# Patient Record
Sex: Male | Born: 1965 | Race: White | Hispanic: No | Marital: Married | Smoking: Never smoker
Health system: Southern US, Community
[De-identification: ages and names within clinical notes are randomized; demographics above are authoritative.]

## PROBLEM LIST (undated history)

## (undated) DIAGNOSIS — M1990 Unspecified osteoarthritis, unspecified site: Secondary | ICD-10-CM

## (undated) HISTORY — PX: APPENDECTOMY: SHX54

---

## 2010-08-30 ENCOUNTER — Ambulatory Visit (HOSPITAL_COMMUNITY)
Admission: RE | Admit: 2010-08-30 | Discharge: 2010-08-30 | Disposition: A | Discharge status: Home / Self Care | Payer: Self-pay | Source: Ambulatory Visit | Attending: Family Medicine | Admitting: Family Medicine

## 2010-08-30 ENCOUNTER — Other Ambulatory Visit (HOSPITAL_COMMUNITY): Payer: Self-pay | Admitting: Family Medicine

## 2010-08-30 DIAGNOSIS — M545 Low back pain, unspecified: Secondary | ICD-10-CM | POA: Insufficient documentation

## 2010-08-30 DIAGNOSIS — M549 Dorsalgia, unspecified: Secondary | ICD-10-CM

## 2012-05-17 IMAGING — CR DG LUMBAR SPINE COMPLETE 4+V
5 series · 5 of 5 positions shown · non-contrast
Comparison: None

CLINICAL DATA: Chronic back pain

LUMBAR SPINE - COMPLETE 4+ VIEW

[view not recorded (1 of 5)]
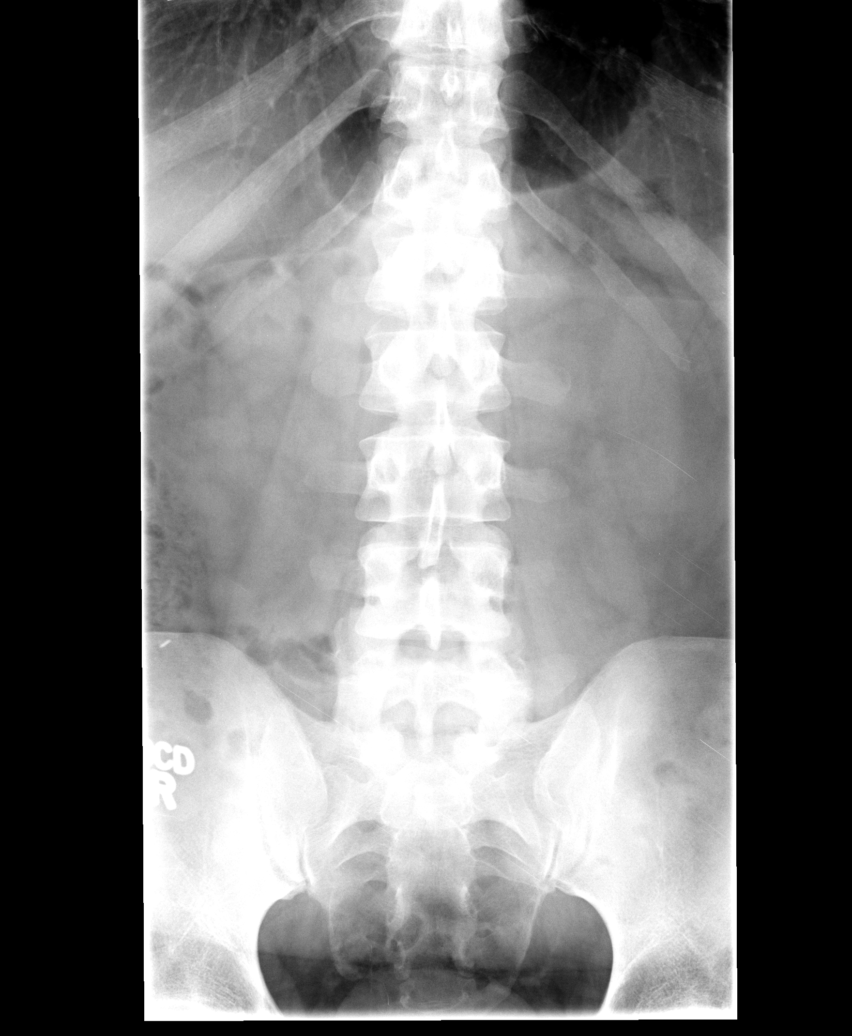

[view not recorded (2 of 5)]
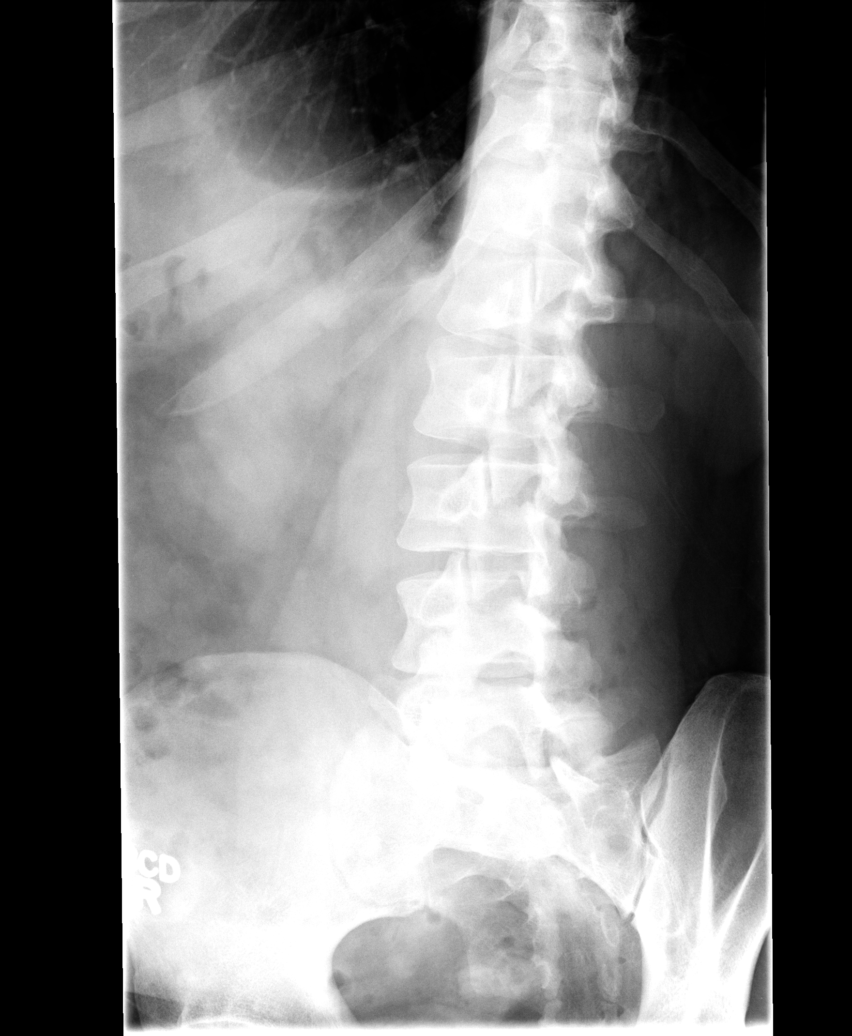

[view not recorded (3 of 5)]
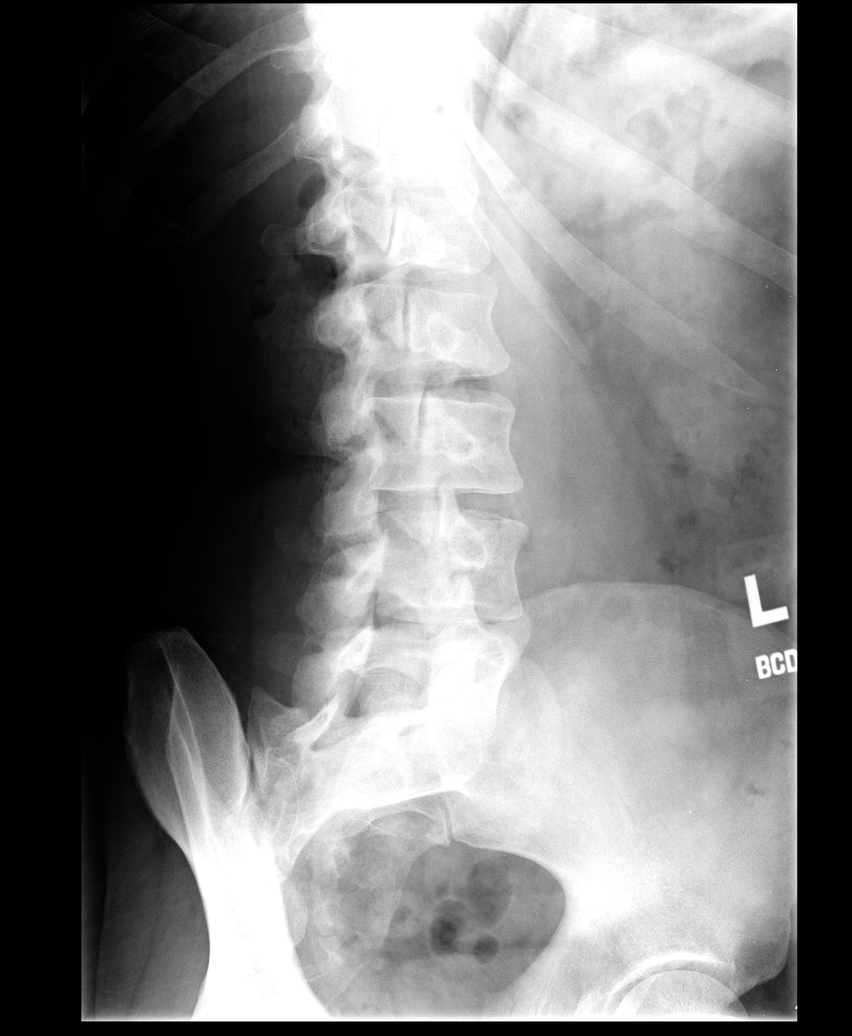

[view not recorded (4 of 5)]
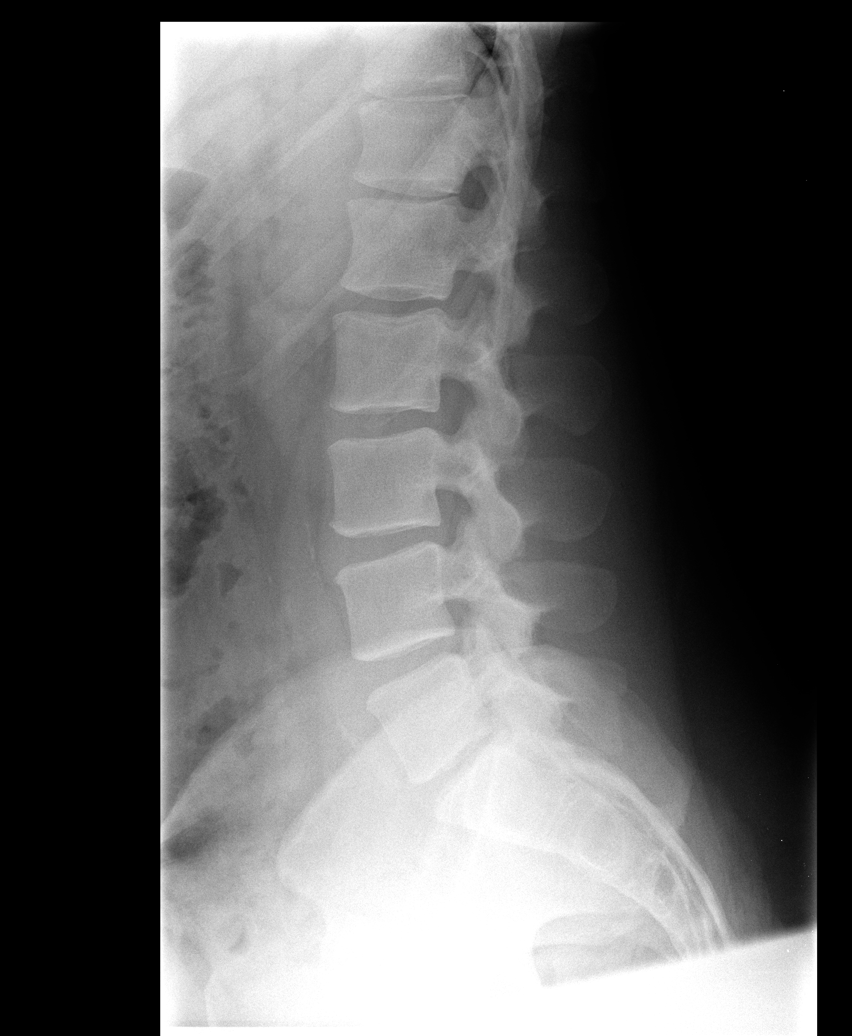

[view not recorded (5 of 5)]
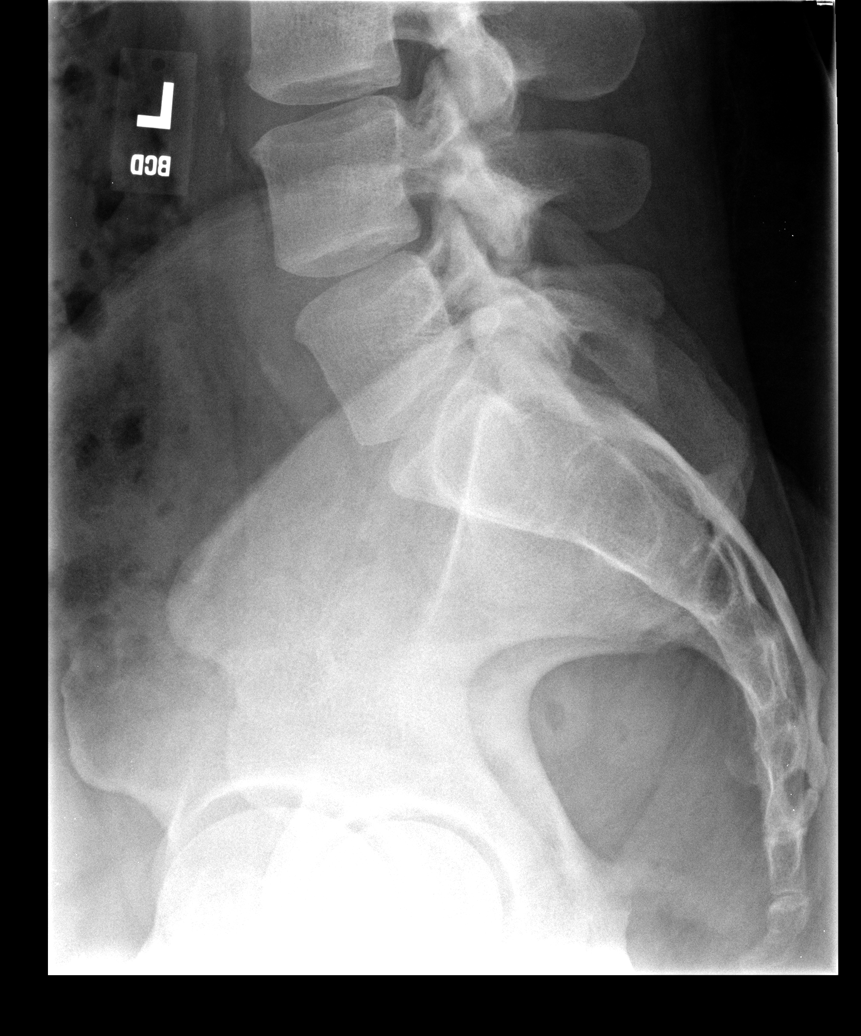

[5 of 5 positions shown; findings below may reference images not displayed]

FINDINGS: Five non-rib bearing lumbar type vertebrae.
Vertebral body and disc space heights maintained.
Bone mineralization normal.
No acute fracture, subluxation or bone destruction.
Minimal scattered atherosclerotic calcification aorta.
No spondylolysis.
SI joints symmetric.
IMPRESSION: No acute abnormalities.

## 2019-01-04 ENCOUNTER — Other Ambulatory Visit: Payer: Self-pay

## 2019-01-04 DIAGNOSIS — Z20822 Contact with and (suspected) exposure to covid-19: Secondary | ICD-10-CM

## 2019-01-06 LAB — NOVEL CORONAVIRUS, NAA: SARS-CoV-2, NAA: NOT DETECTED

## 2022-01-14 DIAGNOSIS — Z419 Encounter for procedure for purposes other than remedying health state, unspecified: Secondary | ICD-10-CM | POA: Diagnosis not present

## 2022-02-14 DIAGNOSIS — Z419 Encounter for procedure for purposes other than remedying health state, unspecified: Secondary | ICD-10-CM | POA: Diagnosis not present

## 2022-03-08 ENCOUNTER — Telehealth: Payer: Self-pay

## 2022-03-08 NOTE — Telephone Encounter (Signed)
Mychart msg sent. AS, CMA 

## 2022-03-17 DIAGNOSIS — Z419 Encounter for procedure for purposes other than remedying health state, unspecified: Secondary | ICD-10-CM | POA: Diagnosis not present

## 2022-04-15 DIAGNOSIS — Z419 Encounter for procedure for purposes other than remedying health state, unspecified: Secondary | ICD-10-CM | POA: Diagnosis not present

## 2022-05-16 DIAGNOSIS — Z419 Encounter for procedure for purposes other than remedying health state, unspecified: Secondary | ICD-10-CM | POA: Diagnosis not present

## 2022-06-15 DIAGNOSIS — Z419 Encounter for procedure for purposes other than remedying health state, unspecified: Secondary | ICD-10-CM | POA: Diagnosis not present

## 2022-07-16 DIAGNOSIS — Z419 Encounter for procedure for purposes other than remedying health state, unspecified: Secondary | ICD-10-CM | POA: Diagnosis not present

## 2022-08-03 ENCOUNTER — Other Ambulatory Visit: Payer: Self-pay

## 2022-08-03 ENCOUNTER — Encounter (HOSPITAL_COMMUNITY): Payer: Self-pay

## 2022-08-03 ENCOUNTER — Emergency Department (HOSPITAL_COMMUNITY)
Admission: EM | Admit: 2022-08-03 | Discharge: 2022-08-03 | Disposition: A | Discharge status: Home / Self Care | Payer: Medicaid Other | Attending: Emergency Medicine | Admitting: Emergency Medicine

## 2022-08-03 ENCOUNTER — Emergency Department (HOSPITAL_COMMUNITY): Payer: Medicaid Other

## 2022-08-03 DIAGNOSIS — R509 Fever, unspecified: Secondary | ICD-10-CM | POA: Diagnosis not present

## 2022-08-03 DIAGNOSIS — Z20822 Contact with and (suspected) exposure to covid-19: Secondary | ICD-10-CM | POA: Diagnosis not present

## 2022-08-03 DIAGNOSIS — M791 Myalgia, unspecified site: Secondary | ICD-10-CM | POA: Diagnosis not present

## 2022-08-03 LAB — BASIC METABOLIC PANEL
Anion gap: 7 (ref 5–15)
BUN: 12 mg/dL (ref 6–20)
CO2: 27 mmol/L (ref 22–32)
Calcium: 8.7 mg/dL — ABNORMAL LOW (ref 8.9–10.3)
Chloride: 99 mmol/L (ref 98–111)
Creatinine, Ser: 1.36 mg/dL — ABNORMAL HIGH (ref 0.61–1.24)
GFR, Estimated: >60 mL/min (ref >60)
Glucose, Bld: 133 mg/dL — ABNORMAL HIGH (ref 70–99)
Potassium: 3.6 mmol/L (ref 3.5–5.1)
Sodium: 133 mmol/L — ABNORMAL LOW (ref 135–145)

## 2022-08-03 LAB — URINALYSIS, ROUTINE W REFLEX MICROSCOPIC
Bacteria, UA: NONE SEEN
Bilirubin Urine: NEGATIVE
Glucose, UA: NEGATIVE mg/dL
Ketones, ur: NEGATIVE mg/dL
Nitrite: NEGATIVE
Protein, ur: NEGATIVE mg/dL
Specific Gravity, Urine: 1.002 — ABNORMAL LOW (ref 1.005–1.030)
pH: 6 (ref 5.0–8.0)

## 2022-08-03 LAB — CULTURE, BLOOD (ROUTINE X 2)

## 2022-08-03 LAB — CBC WITH DIFFERENTIAL/PLATELET
Abs Immature Granulocytes: 0.15 10*3/uL — ABNORMAL HIGH (ref 0.00–0.07)
Basophils Absolute: 0.1 10*3/uL (ref 0.0–0.1)
Basophils Relative: 0 %
Eosinophils Absolute: 0 10*3/uL (ref 0.0–0.5)
Eosinophils Relative: 0 %
HCT: 44.9 % (ref 39.0–52.0)
Hemoglobin: 15.2 g/dL (ref 13.0–17.0)
Immature Granulocytes: 1 %
Lymphocytes Relative: 6 %
Lymphs Abs: 1.3 10*3/uL (ref 0.7–4.0)
MCH: 30.8 pg (ref 26.0–34.0)
MCHC: 33.9 g/dL (ref 30.0–36.0)
MCV: 90.9 fL (ref 80.0–100.0)
Monocytes Absolute: 1.3 10*3/uL — ABNORMAL HIGH (ref 0.1–1.0)
Monocytes Relative: 6 %
Neutro Abs: 19.4 10*3/uL — ABNORMAL HIGH (ref 1.7–7.7)
Neutrophils Relative %: 87 %
Platelets: 176 10*3/uL (ref 150–400)
RBC: 4.94 MIL/uL (ref 4.22–5.81)
RDW: 12.6 % (ref 11.5–15.5)
WBC: 22.1 10*3/uL — ABNORMAL HIGH (ref 4.0–10.5)
nRBC: 0 % (ref 0.0–0.2)

## 2022-08-03 LAB — SARS CORONAVIRUS 2 BY RT PCR: SARS Coronavirus 2 by RT PCR: NEGATIVE

## 2022-08-03 LAB — LACTIC ACID, PLASMA: Lactic Acid, Venous: 0.9 mmol/L (ref 0.5–1.9)

## 2022-08-03 MED ORDER — SODIUM CHLORIDE 0.9 % IV SOLN
1.0000 g | Freq: Once | INTRAVENOUS | Status: AC
  Administered 2022-08-03: 1 g via INTRAVENOUS
  Filled 2022-08-03: qty 10

## 2022-08-03 MED ORDER — CEPHALEXIN 500 MG PO CAPS: 500.0000 mg | ORAL_CAPSULE | Freq: Four times a day (QID) | ORAL | 0 refills | Status: DC

## 2022-08-03 MED ORDER — ACETAMINOPHEN 325 MG PO TABS
650.0000 mg | ORAL_TABLET | Freq: Once | ORAL | Status: AC
Start: 2022-08-03 — End: 2022-08-03
  Administered 2022-08-03: 650 mg via ORAL
  Filled 2022-08-03: qty 2

## 2022-08-03 NOTE — ED Triage Notes (Signed)
Pt states he has been having night sweats at least once a week but last night was worse. Also, states he has been having urinary frequency for about a couple months.

## 2022-08-03 NOTE — ED Provider Notes (Signed)
[place] EMERGENCY DEPARTMENT AT [facility] Provider Note   CSN: [id number] Arrival date & time: 08/03/22  0846     History  Chief Complaint  Patient presents with   Night Sweats   Urinary Frequency    Clifford Hamilton is a 57 y.o. male.   Urinary Frequency Associated symptoms include headaches. Pertinent negatives include no chest pain, no abdominal pain and no shortness of breath.       Clifford Hamilton is a 57 y.o. male who presents to the Emergency Department complaining of generalized bodyaches, chills and night sweats.  Symptoms began yesterday.  States he began having uncontrollable chills and rigors last evening after coming home from work.  Did not feel good during the day.  States he was up frequently during the night urinating.  Admits to drinking lots of water yesterday.  Body aches today with aching frontal headache.  Denies any neck pain or stiffness, visual changes, dizziness, abdominal pain, flank pain or prior UTIs.  Denies any pain swelling or rash to the genital area.  No pain of his testicles.  No history of known tick bite.    Home Medications Prior to Admission medications   Not on File      Allergies    Patient has no allergy information on record.    Review of Systems   Review of Systems  Constitutional:  Positive for chills and diaphoresis. Negative for appetite change and fever.  HENT:  Negative for congestion, sore throat and trouble swallowing.   Respiratory:  Negative for cough and shortness of breath.   Cardiovascular:  Negative for chest pain.  Gastrointestinal:  Negative for abdominal pain, diarrhea, nausea and vomiting.  Genitourinary:  Positive for frequency. Negative for difficulty urinating, flank pain, hematuria, penile discharge, penile pain, penile swelling, scrotal swelling, testicular pain and urgency.  Musculoskeletal:  Positive for myalgias. Negative for neck pain and neck stiffness.  Skin:  Negative for color change and  rash.  Neurological:  Positive for headaches. Negative for dizziness, seizures, syncope, weakness and numbness.  Psychiatric/Behavioral:  Negative for confusion.     Physical Exam Updated Vital Signs BP (!) 144/102   Pulse 90   Temp 99.2 F (37.3 C) (Oral)   Ht 6' (1.829 m)   Wt 109.8 kg   SpO2 97%   BMI 32.82 kg/m  Physical Exam Vitals and nursing note reviewed.  Constitutional:      General: He is not in acute distress.    Appearance: Normal appearance. He is not ill-appearing or toxic-appearing.  Neck:     Meningeal: Kernig's sign absent.  Cardiovascular:     Rate and Rhythm: Normal rate and regular rhythm.     Pulses: Normal pulses.  Pulmonary:     Effort: Pulmonary effort is normal.  Abdominal:     General: There is no distension.     Palpations: Abdomen is soft.     Tenderness: There is no abdominal tenderness. There is no right CVA tenderness, left CVA tenderness or guarding.  Musculoskeletal:        General: Normal range of motion.     Cervical back: Normal range of motion. No rigidity.  Lymphadenopathy:     Cervical: No cervical adenopathy.  Skin:    General: Skin is warm.     Capillary Refill: Capillary refill takes less than 2 seconds.  Neurological:     General: No focal deficit present.     Mental Status: He is alert.  Sensory: No sensory deficit.     Motor: No weakness.     ED Results / Procedures / Treatments   Labs (all labs ordered are listed, but only abnormal results are displayed) Labs Reviewed  CBC WITH DIFFERENTIAL/PLATELET - Abnormal; Notable for the following components:      Result Value   WBC 22.1 (*)    Neutro Abs 19.4 (*)    Monocytes Absolute 1.3 (*)    Abs Immature Granulocytes 0.15 (*)    All other components within normal limits  BASIC METABOLIC PANEL - Abnormal; Notable for the following components:   Sodium 133 (*)    Glucose, Bld 133 (*)    Creatinine, Ser 1.36 (*)    Calcium 8.7 (*)    All other components within  normal limits  URINALYSIS, ROUTINE W REFLEX MICROSCOPIC - Abnormal; Notable for the following components:   Color, Urine STRAW (*)    Specific Gravity, Urine 1.002 (*)    Hgb urine dipstick SMALL (*)    Leukocytes,Ua TRACE (*)    All other components within normal limits  SARS CORONAVIRUS 2 BY RT PCR  URINE CULTURE  CULTURE, BLOOD (ROUTINE X 2)  CULTURE, BLOOD (ROUTINE X 2)  LACTIC ACID, PLASMA    EKG None  Radiology DG Chest Portable 1 View  Result Date: 08/03/2022 CLINICAL DATA:  Fever EXAM: PORTABLE CHEST 1 VIEW COMPARISON:  None Available. FINDINGS: The heart size and mediastinal contours are within normal limits. Both lungs are clear. The visualized skeletal structures are unremarkable. IMPRESSION: No active disease. Electronically Signed   By: Gaylyn Rong M.D.   On: 08/03/2022 11:39    Procedures Procedures    Medications Ordered in ED Medications  acetaminophen (TYLENOL) tablet 650 mg (650 mg Oral Given 08/03/22 1034)  cefTRIAXone (ROCEPHIN) 1 g in sodium chloride 0.9 % 100 mL IVPB (0 g Intravenous Stopped 08/03/22 1300)    ED Course/ Medical Decision Making/ A&P                             Medical Decision Making Patient here with complaint of increased urinary frequency, shaking chills, night sweats, generalized bodyaches and headache.  Symptoms began yesterday.  No nuchal rigidity, dizziness or visual symptoms.  denies abdominal pain, flank pain nausea or vomiting no diarrhea.  No respiratory complaints  Differential would include but not limited to pyelonephritis, UTI, prostatitis, viral process, tick bite.  Patient does endorse urinary frequency, so urine felt to be likely source.  Will obtain labs and urinalysis  Amount and/or Complexity of Data Reviewed Labs: ordered.    Details: Labs interpreted by me, leukocytosis with white count greater than 20,000, hemoglobin unremarkable.  Chemistries show mild elevated serum creatinine without recent kidney  functions available for comparison.  Urinalysis shows small hemoglobin with trace leukocytes and no bacteria.  Urine culture is pending.  Lactic acid unremarkable, COVID test negative, blood cultures pending Radiology: ordered.    Details: Chest x-ray ordered for further evaluation for source of patient's leukocytosis, chest x-ray without acute findings Discussion of management or test interpretation with external provider(s):  Patient is very well-appearing, does not appear septic, no tachycardia and low-grade fever of 99.  Source of his leukocytosis is unclear at this time.  Endorses urinary frequency without burning or other dysuria symptoms.  No flank pain suggestive of pyelonephritis and he has a reassuring abdominal exam.  He denies any known tick bite.  Patient also  seen by Dr. Criss Alvine and care plan discussed  Shared decision making made with patient regarding admission versus outpatient treatment.  He prefers trial with outpatient therapy, does not currently have PCP.  I have discussed at length strict ER return precautions and he verbalized understanding and is agreeable to the plan. will provide prescription for cephalexin  Risk OTC drugs.           Final Clinical Impression(s) / ED Diagnoses Final diagnoses:  Febrile illness    Rx / DC Orders ED Discharge Orders     None         Pauline Aus, PA-C 08/03/22 1401    Pricilla Loveless, MD 08/04/22 [phone]

## 2022-08-03 NOTE — Discharge Instructions (Signed)
Tylenol every 4 hours for fever and/or bodyaches.  Drink plenty of water.  Take the antibiotic as directed.  As discussed, you will need close follow-up with your primary care provider in 24 to 48 hours.  Please return to the emergency department for any new or worsening symptoms

## 2022-08-04 LAB — URINE CULTURE: Culture: NO GROWTH

## 2022-08-04 LAB — CULTURE, BLOOD (ROUTINE X 2)

## 2022-08-05 LAB — CULTURE, BLOOD (ROUTINE X 2): Culture: NO GROWTH

## 2022-08-07 LAB — CULTURE, BLOOD (ROUTINE X 2)

## 2022-08-08 LAB — CULTURE, BLOOD (ROUTINE X 2)

## 2022-08-15 DIAGNOSIS — Z419 Encounter for procedure for purposes other than remedying health state, unspecified: Secondary | ICD-10-CM | POA: Diagnosis not present

## 2022-09-15 DIAGNOSIS — Z419 Encounter for procedure for purposes other than remedying health state, unspecified: Secondary | ICD-10-CM | POA: Diagnosis not present

## 2022-10-16 DIAGNOSIS — Z419 Encounter for procedure for purposes other than remedying health state, unspecified: Secondary | ICD-10-CM | POA: Diagnosis not present

## 2022-11-15 DIAGNOSIS — Z419 Encounter for procedure for purposes other than remedying health state, unspecified: Secondary | ICD-10-CM | POA: Diagnosis not present

## 2022-12-16 DIAGNOSIS — Z419 Encounter for procedure for purposes other than remedying health state, unspecified: Secondary | ICD-10-CM | POA: Diagnosis not present

## 2023-01-15 DIAGNOSIS — Z419 Encounter for procedure for purposes other than remedying health state, unspecified: Secondary | ICD-10-CM | POA: Diagnosis not present

## 2023-02-15 DIAGNOSIS — Z419 Encounter for procedure for purposes other than remedying health state, unspecified: Secondary | ICD-10-CM | POA: Diagnosis not present

## 2023-03-18 DIAGNOSIS — Z419 Encounter for procedure for purposes other than remedying health state, unspecified: Secondary | ICD-10-CM | POA: Diagnosis not present

## 2023-04-15 DIAGNOSIS — Z419 Encounter for procedure for purposes other than remedying health state, unspecified: Secondary | ICD-10-CM | POA: Diagnosis not present

## 2023-04-19 ENCOUNTER — Emergency Department (HOSPITAL_COMMUNITY)

## 2023-04-19 ENCOUNTER — Other Ambulatory Visit: Payer: Self-pay

## 2023-04-19 ENCOUNTER — Emergency Department (HOSPITAL_COMMUNITY)
Admission: EM | Admit: 2023-04-19 | Discharge: 2023-04-19 | Disposition: A | Discharge status: Home / Self Care | Attending: Emergency Medicine | Admitting: Emergency Medicine

## 2023-04-19 ENCOUNTER — Encounter (HOSPITAL_COMMUNITY): Payer: Self-pay

## 2023-04-19 DIAGNOSIS — K5792 Diverticulitis of intestine, part unspecified, without perforation or abscess without bleeding: Secondary | ICD-10-CM | POA: Diagnosis not present

## 2023-04-19 DIAGNOSIS — K5732 Diverticulitis of large intestine without perforation or abscess without bleeding: Secondary | ICD-10-CM | POA: Insufficient documentation

## 2023-04-19 DIAGNOSIS — K8020 Calculus of gallbladder without cholecystitis without obstruction: Secondary | ICD-10-CM | POA: Diagnosis not present

## 2023-04-19 DIAGNOSIS — R1032 Left lower quadrant pain: Secondary | ICD-10-CM | POA: Diagnosis not present

## 2023-04-19 DIAGNOSIS — R109 Unspecified abdominal pain: Secondary | ICD-10-CM | POA: Diagnosis present

## 2023-04-19 LAB — COMPREHENSIVE METABOLIC PANEL
ALT: 18 U/L (ref 0–44)
AST: 16 U/L (ref 15–41)
Albumin: 3.7 g/dL (ref 3.5–5.0)
Alkaline Phosphatase: 49 U/L (ref 38–126)
Anion gap: 10 (ref 5–15)
BUN: 13 mg/dL (ref 6–20)
CO2: 27 mmol/L (ref 22–32)
Calcium: 9 mg/dL (ref 8.9–10.3)
Chloride: 102 mmol/L (ref 98–111)
Creatinine, Ser: 1.46 mg/dL — ABNORMAL HIGH (ref 0.61–1.24)
GFR, Estimated: 56 mL/min — ABNORMAL LOW (ref >60)
Glucose, Bld: 118 mg/dL — ABNORMAL HIGH (ref 70–99)
Potassium: 3.8 mmol/L (ref 3.5–5.1)
Sodium: 139 mmol/L (ref 135–145)
Total Bilirubin: 0.9 mg/dL (ref 0.0–1.2)
Total Protein: 7 g/dL (ref 6.5–8.1)

## 2023-04-19 LAB — CBC WITH DIFFERENTIAL/PLATELET
Abs Immature Granulocytes: 0.03 10*3/uL (ref 0.00–0.07)
Basophils Absolute: 0.1 10*3/uL (ref 0.0–0.1)
Basophils Relative: 1 %
Eosinophils Absolute: 0.1 10*3/uL (ref 0.0–0.5)
Eosinophils Relative: 1 %
HCT: 44.1 % (ref 39.0–52.0)
Hemoglobin: 15 g/dL (ref 13.0–17.0)
Immature Granulocytes: 0 %
Lymphocytes Relative: 39 %
Lymphs Abs: 3.6 10*3/uL (ref 0.7–4.0)
MCH: 31.2 pg (ref 26.0–34.0)
MCHC: 34 g/dL (ref 30.0–36.0)
MCV: 91.7 fL (ref 80.0–100.0)
Monocytes Absolute: 0.5 10*3/uL (ref 0.1–1.0)
Monocytes Relative: 6 %
Neutro Abs: 5 10*3/uL (ref 1.7–7.7)
Neutrophils Relative %: 53 %
Platelets: 216 10*3/uL (ref 150–400)
RBC: 4.81 MIL/uL (ref 4.22–5.81)
RDW: 12.8 % (ref 11.5–15.5)
WBC: 9.4 10*3/uL (ref 4.0–10.5)
nRBC: 0 % (ref 0.0–0.2)

## 2023-04-19 LAB — URINALYSIS, ROUTINE W REFLEX MICROSCOPIC
Bilirubin Urine: NEGATIVE
Glucose, UA: NEGATIVE mg/dL
Hgb urine dipstick: NEGATIVE
Ketones, ur: NEGATIVE mg/dL
Leukocytes,Ua: NEGATIVE
Nitrite: NEGATIVE
Protein, ur: NEGATIVE mg/dL
Specific Gravity, Urine: 1.02 (ref 1.005–1.030)
pH: 5 (ref 5.0–8.0)

## 2023-04-19 LAB — LIPASE, BLOOD: Lipase: 32 U/L (ref 11–51)

## 2023-04-19 MED ORDER — DICYCLOMINE HCL 20 MG PO TABS: 20.0000 mg | ORAL_TABLET | Freq: Two times a day (BID) | ORAL | 0 refills | Status: DC

## 2023-04-19 MED ORDER — AMOXICILLIN-POT CLAVULANATE 875-125 MG PO TABS
1.0000 | ORAL_TABLET | Freq: Once | ORAL | Status: AC
  Administered 2023-04-19: 1 via ORAL
  Filled 2023-04-19: qty 1

## 2023-04-19 MED ORDER — IOHEXOL 300 MG/ML  SOLN
100.0000 mL | Freq: Once | INTRAMUSCULAR | Status: AC | PRN
  Administered 2023-04-19: 100 mL via INTRAVENOUS

## 2023-04-19 MED ORDER — AMOXICILLIN-POT CLAVULANATE 875-125 MG PO TABS: 1.0000 | ORAL_TABLET | Freq: Two times a day (BID) | ORAL | 0 refills | Status: AC

## 2023-04-19 NOTE — ED Provider Notes (Signed)
 [place] EMERGENCY DEPARTMENT AT [facility] Provider Note   CSN: [id number] Arrival date & time: 04/19/23  1157     History  Chief Complaint  Patient presents with   Flank Pain    Clifford Hamilton is a 58 y.o. male.  Patient is a 58 year old male who presents to the emergency department with his wife with a chief complaint of pain to the left side of the abdomen and left flank.  He notes that symptoms have been ongoing for approximate the past 3 days.  He notes that the pain is worse with certain movements.  He denies any associated nausea or vomiting.  He does note that he has diarrhea but this is an ongoing issue for him.  He denies any changes in urination to include dysuria or hematuria.  He has had no associated chest pain, shortness of breath.   Flank Pain       Home Medications Prior to Admission medications   Medication Sig Start Date End Date Taking? Authorizing Provider  cephALEXin (KEFLEX) 500 MG capsule Take 1 capsule (500 mg total) by mouth 4 (four) times daily. 08/03/22   Triplett, Babette Relic, PA-C      Allergies    Patient has no known allergies.    Review of Systems   Review of Systems  Genitourinary:  Positive for flank pain.  All other systems reviewed and are negative.   Physical Exam Updated Vital Signs BP (!) 130/93   Pulse 91   Temp 99 F (37.2 C) (Oral)   Resp 18   Ht 6' (1.829 m)   Wt 112.5 kg   SpO2 96%   BMI 33.63 kg/m  Physical Exam Vitals and nursing note reviewed.  Constitutional:      Appearance: Normal appearance.  HENT:     Head: Normocephalic and atraumatic.     Nose: Nose normal.     Mouth/Throat:     Mouth: Mucous membranes are moist.  Eyes:     Extraocular Movements: Extraocular movements intact.     Conjunctiva/sclera: Conjunctivae normal.     Pupils: Pupils are equal, round, and reactive to light.  Cardiovascular:     Rate and Rhythm: Normal rate and regular rhythm.     Pulses: Normal pulses.     Heart  sounds: Normal heart sounds.  Pulmonary:     Effort: Pulmonary effort is normal. No respiratory distress.     Breath sounds: Normal breath sounds. No stridor. No wheezing or rales.  Abdominal:     General: Abdomen is flat. Bowel sounds are normal. There is no distension.     Palpations: Abdomen is soft. There is no mass.     Tenderness: There is no guarding.     Hernia: No hernia is present.     Comments: Tenderness palpation over the left side of the abdomen, left CVA tenderness  Musculoskeletal:        General: No swelling or tenderness. Normal range of motion.     Cervical back: Normal range of motion and neck supple.  Skin:    General: Skin is warm and dry.  Neurological:     General: No focal deficit present.     Mental Status: He is alert and oriented to person, place, and time. Mental status is at baseline.     Cranial Nerves: No cranial nerve deficit.     Sensory: No sensory deficit.     Motor: No weakness.     Coordination: Coordination normal.  Gait: Gait normal.  Psychiatric:        Mood and Affect: Mood normal.        Behavior: Behavior normal.        Thought Content: Thought content normal.        Judgment: Judgment normal.     ED Results / Procedures / Treatments   Labs (all labs ordered are listed, but only abnormal results are displayed) Labs Reviewed  CBC WITH DIFFERENTIAL/PLATELET  COMPREHENSIVE METABOLIC PANEL  LIPASE, BLOOD  URINALYSIS, ROUTINE W REFLEX MICROSCOPIC    EKG None  Radiology No results found.  Procedures Procedures    Medications Ordered in ED Medications - No data to display  ED Course/ Medical Decision Making/ A&P                                 Medical Decision Making Amount and/or Complexity of Data Reviewed Labs: ordered. Radiology: ordered.  Risk Prescription drug management.   This patient presents to the ED for concern of left-sided abdominal pain and flank pain differential diagnosis includes acute  diverticulitis, pyelonephritis, kidney stone, testicular torsion, mesenteric ischemia, pancreatitis, small bowel obstruction   Additional history obtained:  Additional history obtained from none External records from outside source obtained and reviewed including none   Lab Tests:  I Ordered, and personally interpreted labs.  The pertinent results include: Creatinine at baseline   Imaging Studies ordered:  I ordered imaging studies including CT scan of the abdomen and pelvis I independently visualized and interpreted imaging which showed mild diverticulitis of descending colon, cholelithiasis without signs of acute cholecystitis I agree with the radiologist interpretation   Medicines ordered and prescription drug management:  I ordered medication including Augmentin for acute diverticulitis Reevaluation of the patient after these medicines showed that the patient improved I have reviewed the patients home medicines and have made adjustments as needed   Problem List / ED Course:  Patient is doing well at this time and is stable for discharge home.  Discussed with patient that CT scan of the abdomen and pelvis is consistent with acute diverticulitis.  Patient has no indication for perforation or abscess remission.  He has stable vital signs at this point with no indication for sepsis.  He has no associated leukocytosis and is tolerating p.o. intake without difficulty.  His creatinine is at his baseline at this time.  He has no clinical indication for dehydration.  Urinalysis demonstrates no indication for urinary tract infection.  Will place patient on p.o. antibiotics and stressed the importance of follow-up with gastroenterology for colonoscopy.  Strict turn precautions were provided for any new or worsening symptoms.  Patient and wife voiced understanding and had no additional questions.   Social Determinants of Health:  None           Final Clinical Impression(s) / ED  Diagnoses Final diagnoses:  None    Rx / DC Orders ED Discharge Orders     None         Lelon Perla, PA-C 04/19/23 1853    Pricilla Loveless, MD 04/20/23 1029

## 2023-04-19 NOTE — Discharge Instructions (Signed)
 Please take all antibiotics as directed.  Please follow-up closely with gastroenterology on an outpatient basis for colonoscopy once this flare clears.  Return to emergency department immediately for any new or worsening symptoms to include worsening abdominal pain, fever, intractable nausea, not moving your bowels at your baseline.

## 2023-04-19 NOTE — ED Triage Notes (Signed)
 Pt arrived via POV c/o left flank pain X days. Pt reports pain became severe after he went to move his "trash can today." Pt reports pain radiates to his groin.

## 2023-04-28 NOTE — H&P (View-Only) (Signed)
 GI Office Note    Referring Provider: No ref. provider found Primary Care Physician:  Patient, No Pcp Per  Primary Gastroenterologist: Hennie Duos. Marletta Lor, DO  Chief Complaint   Chief Complaint  Patient presents with   Hospitalization Follow-up    Pt seen in ER on 04/19/23 for left sided pain and swelling on left side of stomach   History of Present Illness   Clifford Hamilton is a 58 y.o. male presenting today at the request of No ref. provider found for ED follow-up of abdominal pain and diverticulitis  Presented to the ED 04/19/2023 with left-sided abdominal pain and left flank pain occurring for 3 days, worse with certain movements.  Denies nausea or vomiting.  Noted some diarrhea but that is an ongoing issue for him.  Underwent CT scan as noted below.  Discharged with Augmentin and given dicyclomine for abdominal pain.  CT A/P 04/19/2023 impression: -Mild diverticulitis involving distal descending colon -Cholelithiasis (2 cm within the gallbladder lumen) -Appendix surgically absent  Today:  Not doing better, Still having some left sided pain but still having swelling. All of this pain has been going on and off for about 10 years. States that day of his ED visit the swelling was very large and wife said he needed to go to the ED. Pain took him to his knees. Reports his WBC was elevated. Is currently having pain 3/10 on the left side. If he tries to move or pick up something his pain worsens and also occurs iof he sits upright for too long.   Has BM every morning unless a change in schedule. 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 Does not have any problems. Has light blood in his stool. This does not occur with every bowel movement. May happened for 3-4 weeks and then it comes in spurts. It can feel up the commode at times as well. Does not strain with BM. Maybe once every 2-3 months he may feel a little constipated. Stools are soft and formed. Stools are usually brown  and green. No melena. Does have some rectal pain most of the time on the right side.   Father passed of CRC (diagnosed at 62, passed at 55) - had urostomy and colostomy given metastasis and paternal grandfather passed as well from [facility].  No known family history of IBD.   Wt Readings from Last 3 Encounters:  05/01/23 246 lb 9.6 oz (111.9 kg)  04/19/23 248 lb (112.5 kg)  08/03/22 242 lb (109.8 kg)    Current Outpatient Medications  Medication Sig Dispense Refill   dicyclomine (BENTYL) 20 MG tablet Take 1 tablet (20 mg total) by mouth 2 (two) times daily. 20 tablet 0   No current facility-administered medications for this visit.    No past medical history on file.  Past Surgical History:  Procedure Laterality Date   APPENDECTOMY      Family History  Problem Relation Age of Onset   Cancer Father     Allergies as of 05/01/2023   (No Known Allergies)    Social History   Socioeconomic History   Marital status: Married    Spouse name: Not on file   Number of children: Not on file   Years of education: Not on file   Highest education level: Not on file  Occupational History   Not on file  Tobacco Use   Smoking status: Never    Passive exposure: Never   Smokeless tobacco: Never  Vaping Use   Vaping status: Never Used  Substance and Sexual Activity   Alcohol use: Never   Drug use: Yes    Types: Marijuana    Comment: last use was yesterday   Sexual activity: Yes  Other Topics Concern   Not on file  Social History Narrative   Not on file   Social Drivers of Health   Financial Resource Strain: Not on file  Food Insecurity: Not on file  Transportation Needs: Not on file  Physical Activity: Not on file  Stress: Not on file  Social Connections: Not on file  Intimate Partner Violence: Not on file   Review of Systems   Gen: Denies any fever, chills, fatigue, weight loss, lack of appetite.  CV: Denies chest pain, heart palpitations, peripheral edema, syncope.   Resp: Denies shortness of breath at rest or with exertion. Denies wheezing or cough.  GI: see HPI GU : Denies urinary burning, urinary frequency, urinary hesitancy MS: Denies joint pain, muscle weakness, cramps, or limitation of movement.  Derm: Denies rash, itching, dry skin Psych: Denies depression, anxiety, memory loss, and confusion Heme: Denies bruising, bleeding, and enlarged lymph nodes.  Physical Exam   BP 119/76 (BP Location: Right Arm, Patient Position: Sitting, Cuff Size: Large)   Pulse 70   Temp 98.3 F (36.8 C) (Oral)   Ht 6' (1.829 m)   Wt 246 lb 9.6 oz (111.9 kg)   BMI 33.44 kg/m   General:   Alert and oriented. Pleasant and cooperative. Well-nourished and well-developed.  Head:  Normocephalic and atraumatic. Eyes:  Without icterus, sclera clear and conjunctiva pink.  Ears:  Normal auditory acuity. Mouth:  No deformity or lesions, oral mucosa pink.  Lungs:  Clear to auscultation bilaterally. No wheezes, rales, or rhonchi. No distress.  Heart:  S1, S2 present without murmurs appreciated.  Abdomen:  +BS, soft.  TTP to left groin as well as left lower quadrant.  Mild swelling to left upper quadrant.  No obvious hernia palpated.  No HSM noted. No guarding or rebound. No masses appreciated.  Rectal: Good rectal tone.  Evidence of prior external hemorrhoid to right side.  Small palpable hemorrhoids noted to right anterior column.  No overt pain on exam.  No evidence of rectal mass. Msk:  Symmetrical without gross deformities. Normal posture. Extremities:  Without edema. Neurologic:  Alert and  oriented x4;  grossly normal neurologically. Skin:  Intact without significant lesions or rashes. Psych:  Alert and cooperative. Normal mood and affect.  Assessment   Clifford Hamilton is a 58 y.o. male with recent diagnosis of diverticulitis presenting today for evaluation post ED visit.  Acute diverticulitis, left-sided abdominal pain and swelling: Recent ED visit for abdominal  pain with left-sided swelling with CT indicating mild left-sided diverticulitis.  Treated with course of Augmentin.  His pain has not worsened however has not significantly improved either.  This pain that he has been having is  chronic for about 10 years but acutely worsened recently.  Currently taking dicyclomine twice daily as needed for abdominal pain.  Having to adjust the way he sits due to pain. For now we will continue dicyclomine.  Need further evaluation with colonoscopy as per below.  Given ongoing pain and tenderness today on exam despite recent antibiotics, will repeat CT abdomen pelvis to assess for any complication of recent diverticulitis given no significant improvement in pain.  Rectal bleeding with pain, family history of colon cancer: History of colon cancer in his family and  his father as well as paternal grandfather.  Father diagnosed at age 60 and underwent colectomy, passed in his 5s.  Patient currently has been having intermittent light red bleeding for some time.  They have it for 3-4 weeks in a row and then have improvement.  Denies straining with bowel movements.  External hemorrhoid in the past noted on exam today.  No rectal mass identified.  If recurrent rectal bleeding recommend Preparation H twice daily for 1 week and use of lidocaine to help with discomfort.  Rectal pain and bleeding could be secondary to hemorrhoids however given family history we are assessing with a colonoscopy.  PLAN    Proceed with colonoscopy with propofol by Dr. Marletta Lor  in near future: the risks, benefits, and alternatives have been discussed with the patient in detail. The patient states understanding and desires to proceed. ASA 2 (ASAP) Continue dicyclomine twice daily as needed CT A/P ASAP Preparation H with lidocaine if recurrent rectal bleeding. Follow-up 8-10 weeks (post procedure)    Brooke Bonito, MSN, FNP-BC, AGACNP-BC [facility]

## 2023-04-28 NOTE — Progress Notes (Signed)
 GI Office Note    Referring Provider: No ref. provider found Primary Care Physician:  Patient, No Pcp Per  Primary Gastroenterologist: Hennie Duos. Marletta Lor, DO  Chief Complaint   Chief Complaint  Patient presents with   Hospitalization Follow-up    Pt seen in ER on 04/19/23 for left sided pain and swelling on left side of stomach   History of Present Illness   Clifford Hamilton is a 58 y.o. male presenting today at the request of No ref. provider found for ED follow-up of abdominal pain and diverticulitis  Presented to the ED 04/19/2023 with left-sided abdominal pain and left flank pain occurring for 3 days, worse with certain movements.  Denies nausea or vomiting.  Noted some diarrhea but that is an ongoing issue for him.  Underwent CT scan as noted below.  Discharged with Augmentin and given dicyclomine for abdominal pain.  CT A/P 04/19/2023 impression: -Mild diverticulitis involving distal descending colon -Cholelithiasis (2 cm within the gallbladder lumen) -Appendix surgically absent  Today:  Not doing better, Still having some left sided pain but still having swelling. All of this pain has been going on and off for about 10 years. States that day of his ED visit the swelling was very large and wife said he needed to go to the ED. Pain took him to his knees. Reports his WBC was elevated. Is currently having pain 3/10 on the left side. If he tries to move or pick up something his pain worsens and also occurs iof he sits upright for too long.   Has BM every morning unless a change in schedule. 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 Does not have any problems. Has light blood in his stool. This does not occur with every bowel movement. May happened for 3-4 weeks and then it comes in spurts. It can feel up the commode at times as well. Does not strain with BM. Maybe once every 2-3 months he may feel a little constipated. Stools are soft and formed. Stools are usually brown  and green. No melena. Does have some rectal pain most of the time on the right side.   Father passed of CRC (diagnosed at 62, passed at 55) - had urostomy and colostomy given metastasis and paternal grandfather passed as well from [facility].  No known family history of IBD.   Wt Readings from Last 3 Encounters:  05/01/23 246 lb 9.6 oz (111.9 kg)  04/19/23 248 lb (112.5 kg)  08/03/22 242 lb (109.8 kg)    Current Outpatient Medications  Medication Sig Dispense Refill   dicyclomine (BENTYL) 20 MG tablet Take 1 tablet (20 mg total) by mouth 2 (two) times daily. 20 tablet 0   No current facility-administered medications for this visit.    No past medical history on file.  Past Surgical History:  Procedure Laterality Date   APPENDECTOMY      Family History  Problem Relation Age of Onset   Cancer Father     Allergies as of 05/01/2023   (No Known Allergies)    Social History   Socioeconomic History   Marital status: Married    Spouse name: Not on file   Number of children: Not on file   Years of education: Not on file   Highest education level: Not on file  Occupational History   Not on file  Tobacco Use   Smoking status: Never    Passive exposure: Never   Smokeless tobacco: Never  Vaping Use   Vaping status: Never Used  Substance and Sexual Activity   Alcohol use: Never   Drug use: Yes    Types: Marijuana    Comment: last use was yesterday   Sexual activity: Yes  Other Topics Concern   Not on file  Social History Narrative   Not on file   Social Drivers of Health   Financial Resource Strain: Not on file  Food Insecurity: Not on file  Transportation Needs: Not on file  Physical Activity: Not on file  Stress: Not on file  Social Connections: Not on file  Intimate Partner Violence: Not on file   Review of Systems   Gen: Denies any fever, chills, fatigue, weight loss, lack of appetite.  CV: Denies chest pain, heart palpitations, peripheral edema, syncope.   Resp: Denies shortness of breath at rest or with exertion. Denies wheezing or cough.  GI: see HPI GU : Denies urinary burning, urinary frequency, urinary hesitancy MS: Denies joint pain, muscle weakness, cramps, or limitation of movement.  Derm: Denies rash, itching, dry skin Psych: Denies depression, anxiety, memory loss, and confusion Heme: Denies bruising, bleeding, and enlarged lymph nodes.  Physical Exam   BP 119/76 (BP Location: Right Arm, Patient Position: Sitting, Cuff Size: Large)   Pulse 70   Temp 98.3 F (36.8 C) (Oral)   Ht 6' (1.829 m)   Wt 246 lb 9.6 oz (111.9 kg)   BMI 33.44 kg/m   General:   Alert and oriented. Pleasant and cooperative. Well-nourished and well-developed.  Head:  Normocephalic and atraumatic. Eyes:  Without icterus, sclera clear and conjunctiva pink.  Ears:  Normal auditory acuity. Mouth:  No deformity or lesions, oral mucosa pink.  Lungs:  Clear to auscultation bilaterally. No wheezes, rales, or rhonchi. No distress.  Heart:  S1, S2 present without murmurs appreciated.  Abdomen:  +BS, soft.  TTP to left groin as well as left lower quadrant.  Mild swelling to left upper quadrant.  No obvious hernia palpated.  No HSM noted. No guarding or rebound. No masses appreciated.  Rectal: Good rectal tone.  Evidence of prior external hemorrhoid to right side.  Small palpable hemorrhoids noted to right anterior column.  No overt pain on exam.  No evidence of rectal mass. Msk:  Symmetrical without gross deformities. Normal posture. Extremities:  Without edema. Neurologic:  Alert and  oriented x4;  grossly normal neurologically. Skin:  Intact without significant lesions or rashes. Psych:  Alert and cooperative. Normal mood and affect.  Assessment   Clifford Hamilton is a 58 y.o. male with recent diagnosis of diverticulitis presenting today for evaluation post ED visit.  Acute diverticulitis, left-sided abdominal pain and swelling: Recent ED visit for abdominal  pain with left-sided swelling with CT indicating mild left-sided diverticulitis.  Treated with course of Augmentin.  His pain has not worsened however has not significantly improved either.  This pain that he has been having is  chronic for about 10 years but acutely worsened recently.  Currently taking dicyclomine twice daily as needed for abdominal pain.  Having to adjust the way he sits due to pain. For now we will continue dicyclomine.  Need further evaluation with colonoscopy as per below.  Given ongoing pain and tenderness today on exam despite recent antibiotics, will repeat CT abdomen pelvis to assess for any complication of recent diverticulitis given no significant improvement in pain.  Rectal bleeding with pain, family history of colon cancer: History of colon cancer in his family and  his father as well as paternal grandfather.  Father diagnosed at age 60 and underwent colectomy, passed in his 5s.  Patient currently has been having intermittent light red bleeding for some time.  They have it for 3-4 weeks in a row and then have improvement.  Denies straining with bowel movements.  External hemorrhoid in the past noted on exam today.  No rectal mass identified.  If recurrent rectal bleeding recommend Preparation H twice daily for 1 week and use of lidocaine to help with discomfort.  Rectal pain and bleeding could be secondary to hemorrhoids however given family history we are assessing with a colonoscopy.  PLAN    Proceed with colonoscopy with propofol by Dr. Marletta Lor  in near future: the risks, benefits, and alternatives have been discussed with the patient in detail. The patient states understanding and desires to proceed. ASA 2 (ASAP) Continue dicyclomine twice daily as needed CT A/P ASAP Preparation H with lidocaine if recurrent rectal bleeding. Follow-up 8-10 weeks (post procedure)    Brooke Bonito, MSN, FNP-BC, AGACNP-BC [facility]

## 2023-05-01 ENCOUNTER — Encounter: Payer: Self-pay | Admitting: Gastroenterology

## 2023-05-01 ENCOUNTER — Ambulatory Visit (INDEPENDENT_AMBULATORY_CARE_PROVIDER_SITE_OTHER): Admitting: Gastroenterology

## 2023-05-01 ENCOUNTER — Encounter: Payer: Self-pay | Admitting: *Deleted

## 2023-05-01 ENCOUNTER — Telehealth: Payer: Self-pay | Admitting: *Deleted

## 2023-05-01 ENCOUNTER — Other Ambulatory Visit: Payer: Self-pay | Admitting: *Deleted

## 2023-05-01 VITALS — BP 119/76 | HR 70 | Temp 98.3°F | Ht 72.0 in | Wt 246.6 lb

## 2023-05-01 DIAGNOSIS — R1900 Intra-abdominal and pelvic swelling, mass and lump, unspecified site: Secondary | ICD-10-CM

## 2023-05-01 DIAGNOSIS — R1032 Left lower quadrant pain: Secondary | ICD-10-CM

## 2023-05-01 DIAGNOSIS — Z800 Family history of malignant neoplasm of digestive organs: Secondary | ICD-10-CM

## 2023-05-01 DIAGNOSIS — K625 Hemorrhage of anus and rectum: Secondary | ICD-10-CM

## 2023-05-01 DIAGNOSIS — K5792 Diverticulitis of intestine, part unspecified, without perforation or abscess without bleeding: Secondary | ICD-10-CM | POA: Diagnosis not present

## 2023-05-01 MED ORDER — PEG 3350-KCL-NA BICARB-NACL 420 G PO SOLR: 4000.0000 mL | Freq: Once | ORAL | 0 refills | Status: AC

## 2023-05-01 MED ORDER — DICYCLOMINE HCL 20 MG PO TABS: 20.0000 mg | ORAL_TABLET | Freq: Two times a day (BID) | ORAL | 1 refills | Status: AC

## 2023-05-01 NOTE — Patient Instructions (Addendum)
 We are getting you scheduled for colonoscopy in the near future with Dr. Marletta Lor.  Continue taking dicyclomine twice daily as needed for the abdominal pain.   Before I give you a specific diet to follow, I want to reassess that you no longer have diverticulitis.  Given your ongoing pain that extends into your groin as well as the swelling I am ordering a repeat CT scan of your abdomen and pelvis.   If you begin having any change in bowel movements please let me know.  Dicyclomine can cause some constipation if used frequently.  If you begin to have any recurrent rectal bleeding anytime between now and your next visit I want you to try some Preparation H over-the-counter with lidocaine twice daily for a week.  Follow up 8-10 weeks.   It was a pleasure to see you today. I want to create trusting relationships with patients. If you receive a survey regarding your visit,  I greatly appreciate you taking time to fill this out on paper or through your MyChart. I value your feedback.  Brooke Bonito, MSN, FNP-BC, AGACNP-BC [facility]

## 2023-05-01 NOTE — Telephone Encounter (Signed)
 RADmd PA for CT: Current Status: Pending Validity Period: [Not Applicable] Tracking Number: [id number]

## 2023-05-01 NOTE — Telephone Encounter (Signed)
 RADmd PA for CT: Request [id number] Request Date: 05/01/2023 08:28 AM Status: Approved Entry Method: RadMD Validity Dates: 05/01/2023-06/30/2023

## 2023-05-01 NOTE — Addendum Note (Signed)
 Addended by: Aida Raider on: 05/01/2023 10:53 AM   Modules accepted: Orders

## 2023-05-03 ENCOUNTER — Ambulatory Visit (HOSPITAL_COMMUNITY)
Admission: RE | Admit: 2023-05-03 | Discharge: 2023-05-03 | Disposition: A | Discharge status: Home / Self Care | Source: Ambulatory Visit | Attending: Gastroenterology | Admitting: Gastroenterology

## 2023-05-03 DIAGNOSIS — R1032 Left lower quadrant pain: Secondary | ICD-10-CM | POA: Insufficient documentation

## 2023-05-03 DIAGNOSIS — K5730 Diverticulosis of large intestine without perforation or abscess without bleeding: Secondary | ICD-10-CM | POA: Diagnosis not present

## 2023-05-03 DIAGNOSIS — K8020 Calculus of gallbladder without cholecystitis without obstruction: Secondary | ICD-10-CM | POA: Diagnosis not present

## 2023-05-03 MED ORDER — IOHEXOL 300 MG/ML  SOLN
100.0000 mL | Freq: Once | INTRAMUSCULAR | Status: AC | PRN
  Administered 2023-05-03: 100 mL via INTRAVENOUS

## 2023-05-16 ENCOUNTER — Ambulatory Visit (HOSPITAL_BASED_OUTPATIENT_CLINIC_OR_DEPARTMENT_OTHER): Admitting: Anesthesiology

## 2023-05-16 ENCOUNTER — Encounter (HOSPITAL_COMMUNITY)
Admission: RE | Disposition: A | Discharge status: Home / Self Care | Payer: Self-pay | Source: Home / Self Care | Attending: Internal Medicine

## 2023-05-16 ENCOUNTER — Ambulatory Visit (HOSPITAL_COMMUNITY): Admitting: Anesthesiology

## 2023-05-16 ENCOUNTER — Ambulatory Visit (HOSPITAL_COMMUNITY)
Admission: RE | Admit: 2023-05-16 | Discharge: 2023-05-16 | Disposition: A | Discharge status: Home / Self Care | Attending: Internal Medicine | Admitting: Internal Medicine

## 2023-05-16 ENCOUNTER — Other Ambulatory Visit: Payer: Self-pay

## 2023-05-16 ENCOUNTER — Encounter (HOSPITAL_COMMUNITY): Payer: Self-pay | Admitting: Internal Medicine

## 2023-05-16 DIAGNOSIS — D123 Benign neoplasm of transverse colon: Secondary | ICD-10-CM | POA: Diagnosis not present

## 2023-05-16 DIAGNOSIS — K648 Other hemorrhoids: Secondary | ICD-10-CM

## 2023-05-16 DIAGNOSIS — K5730 Diverticulosis of large intestine without perforation or abscess without bleeding: Secondary | ICD-10-CM

## 2023-05-16 DIAGNOSIS — K6389 Other specified diseases of intestine: Secondary | ICD-10-CM | POA: Diagnosis not present

## 2023-05-16 DIAGNOSIS — K635 Polyp of colon: Secondary | ICD-10-CM | POA: Diagnosis not present

## 2023-05-16 DIAGNOSIS — D122 Benign neoplasm of ascending colon: Secondary | ICD-10-CM

## 2023-05-16 DIAGNOSIS — K6289 Other specified diseases of anus and rectum: Secondary | ICD-10-CM

## 2023-05-16 DIAGNOSIS — D124 Benign neoplasm of descending colon: Secondary | ICD-10-CM

## 2023-05-16 DIAGNOSIS — K625 Hemorrhage of anus and rectum: Secondary | ICD-10-CM

## 2023-05-16 DIAGNOSIS — Z79899 Other long term (current) drug therapy: Secondary | ICD-10-CM | POA: Insufficient documentation

## 2023-05-16 DIAGNOSIS — Z800 Family history of malignant neoplasm of digestive organs: Secondary | ICD-10-CM

## 2023-05-16 HISTORY — DX: Unspecified osteoarthritis, unspecified site: M19.90

## 2023-05-16 HISTORY — PX: POLYPECTOMY: SHX5525

## 2023-05-16 HISTORY — PX: COLONOSCOPY: SHX5424

## 2023-05-16 SURGERY — COLONOSCOPY
Anesthesia: General

## 2023-05-16 MED ORDER — LIDOCAINE HCL (CARDIAC) PF 100 MG/5ML IV SOSY
PREFILLED_SYRINGE | INTRAVENOUS | Status: DC | PRN
  Administered 2023-05-16: 100 mg via INTRATRACHEAL

## 2023-05-16 MED ORDER — SODIUM CHLORIDE 0.9% FLUSH: 3.0000 mL | Freq: Two times a day (BID) | INTRAVENOUS | Status: DC

## 2023-05-16 MED ORDER — SODIUM CHLORIDE 0.9% FLUSH: 3.0000 mL | INTRAVENOUS | Status: DC | PRN

## 2023-05-16 MED ORDER — STERILE WATER FOR IRRIGATION IR SOLN
Status: DC | PRN
  Administered 2023-05-16: 60 mL

## 2023-05-16 MED ORDER — PROPOFOL 10 MG/ML IV BOLUS
INTRAVENOUS | Status: DC | PRN
Start: 2023-05-16 — End: 2023-05-16
  Administered 2023-05-16: 70 mg via INTRAVENOUS
  Administered 2023-05-16: 50 mg via INTRAVENOUS
  Administered 2023-05-16: 180 mg via INTRAVENOUS

## 2023-05-16 MED ORDER — LACTATED RINGERS IV SOLN: INTRAVENOUS | Status: DC | PRN

## 2023-05-16 NOTE — Anesthesia Postprocedure Evaluation (Signed)
 Anesthesia Post Note  Patient: Clifford Hamilton  Procedure(s) Performed: COLONOSCOPY POLYPECTOMY  Patient location during evaluation: PACU Anesthesia Type: General Level of consciousness: awake and alert Pain management: pain level controlled Vital Signs Assessment: post-procedure vital signs reviewed and stable Respiratory status: spontaneous breathing, nonlabored ventilation, respiratory function stable and patient connected to nasal cannula oxygen Cardiovascular status: blood pressure returned to baseline and stable Postop Assessment: no apparent nausea or vomiting Anesthetic complications: no   There were no known notable events for this encounter.   Last Vitals:  Vitals:   05/16/23 1128 05/16/23 1253  BP: (!) 148/98 117/70  Pulse: 60 68  Resp: 11 15  Temp: 36.7 C 36.8 C  SpO2: 99% 98%    Last Pain:  Vitals:   05/16/23 1253  TempSrc: Oral  PainSc: 0-No pain                 Deni Lefever L Benz [place]

## 2023-05-16 NOTE — Transfer of Care (Signed)
 Immediate Anesthesia Transfer of Care Note  Patient: Clifford Hamilton  Procedure(s) Performed: COLONOSCOPY POLYPECTOMY  Patient Location: Endoscopy Unit  Anesthesia Type:General  Level of Consciousness: drowsy and patient cooperative  Airway & Oxygen Therapy: Patient Spontanous Breathing  Post-op Assessment: Report given to RN and Post -op Vital signs reviewed and stable  Post vital signs: Reviewed and stable  Last Vitals:  Vitals Value Taken Time  BP 117/70 05/16/23 1253  Temp 36.8 C 05/16/23 1253  Pulse 68 05/16/23 1253  Resp 15 05/16/23 1253  SpO2 98 % 05/16/23 1253    Last Pain:  Vitals:   05/16/23 1253  TempSrc: Oral  PainSc: 0-No pain      Patients Stated Pain Goal: 5 (05/16/23 1128)  Complications: No notable events documented.

## 2023-05-16 NOTE — Op Note (Signed)
 [facility] Patient Name: Clifford Hamilton Procedure Date: 05/16/2023 12:19 PM MRN: [id number] Date of Birth: [birth year] Attending MD: Hennie Duos. Marletta Lor , [place], [id number] CSN: [id number] Age: 58 Admit Type: Outpatient Procedure:                Colonoscopy Indications:              Rectal bleeding, Follow-up of diverticulitis Providers:                Hennie Duos. Marletta Lor, DO, Francoise Ceo RN, RN, Lennice Sites Technician, Technician Referring MD:              Medicines:                See the Anesthesia note for documentation of the                            administered medications Complications:            No immediate complications. Estimated Blood Loss:     Estimated blood loss was minimal. Procedure:                Pre-Anesthesia Assessment:                           - The anesthesia plan was to use monitored                            anesthesia care (MAC).                           After obtaining informed consent, the colonoscope                            was passed under direct vision. Throughout the                            procedure, the patient's blood pressure, pulse, and                            oxygen saturations were monitored continuously. The                            [id number]-HQ190L ([id number]) scope was introduced through                            the anus and advanced to the the cecum, identified                            by appendiceal orifice and ileocecal valve. The                            colonoscopy was performed without difficulty. The                            patient tolerated the  procedure well. The quality                            of the bowel preparation was evaluated using the                            BBPS [facility] Bowel Preparation Scale) with scores                            of: Right Colon = 3, Transverse Colon = 3 and Left                            Colon = 3 (entire mucosa seen well with no residual                             staining, small fragments of stool or opaque                            liquid). The total BBPS score equals 9. Scope In: 12:30:56 PM Scope Out: 12:47:03 PM Scope Withdrawal Time: 0 hours 13 minutes 56 seconds  Total Procedure Duration: 0 hours 16 minutes 7 seconds  Findings:      Non-bleeding internal hemorrhoids were found during endoscopy.      Multiple large-mouthed and small-mouthed diverticula were found in the       sigmoid colon, descending colon, transverse colon and ascending colon.      A 2 mm polyp was found in the ascending colon. The polyp was sessile.       The polyp was removed with a cold biopsy forceps. Resection and       retrieval were complete.      Two sessile polyps were found in the transverse colon. The polyps were 4       to 5 mm in size. These polyps were removed with a cold snare. Resection       and retrieval were complete.      An 8 mm polyp was found in the descending colon. The polyp was       pedunculated. The polyp was removed with a cold snare. Resection and       retrieval were complete.      A localized area of mildly hyperemic mucosa was found in the sigmoid       colon. Liekly resolving diverticulitis vs prep artifact Impression:               - Non-bleeding internal hemorrhoids.                           - Diverticulosis in the sigmoid colon, in the                            descending colon, in the transverse colon and in                            the ascending colon.                           - One  2 mm polyp in the ascending colon, removed                            with a cold biopsy forceps. Resected and retrieved.                           - Two 4 to 5 mm polyps in the transverse colon,                            removed with a cold snare. Resected and retrieved.                           - One 8 mm polyp in the descending colon, removed                            with a cold snare. Resected and retrieved.                            - Erythematous mucosa in the sigmoid colon. Moderate Sedation:      Per Anesthesia Care Recommendation:           - Patient has a contact number available for                            emergencies. The signs and symptoms of potential                            delayed complications were discussed with the                            patient. Return to normal activities tomorrow.                            Written discharge instructions were provided to the                            patient.                           - Resume previous diet.                           - Continue present medications.                           - Await pathology results.                           - Repeat colonoscopy in 5 years for surveillance.                           - Return to GI clinic in 8 weeks. Procedure Code(s):        --- Professional ---  56387, Colonoscopy, flexible; with removal of                            tumor(s), polyp(s), or other lesion(s) by snare                            technique                           45380, 59, Colonoscopy, flexible; with biopsy,                            single or multiple Diagnosis Code(s):        --- Professional ---                           K64.8, Other hemorrhoids                           D12.2, Benign neoplasm of ascending colon                           D12.4, Benign neoplasm of descending colon                           D12.3, Benign neoplasm of transverse colon (hepatic                            flexure or splenic flexure)                           K63.89, Other specified diseases of intestine                           K62.5, Hemorrhage of anus and rectum                           K57.32, Diverticulitis of large intestine without                            perforation or abscess without bleeding                           K57.30, Diverticulosis of large intestine without                            perforation or abscess  without bleeding CPT copyright 2022 American Medical Association. All rights reserved. The codes documented in this report are preliminary and upon coder review may  be revised to meet current compliance requirements. Hennie Duos. Marletta Lor, DO Hennie Duos. Marletta Lor, DO 05/16/2023 12:51:47 PM This report has been signed electronically. Number of Addenda: 0

## 2023-05-16 NOTE — Anesthesia Preprocedure Evaluation (Signed)
 Anesthesia Evaluation  Patient identified by MRN, date of birth, ID band Patient awake    Reviewed: Allergy & Precautions, H&P , NPO status , Patient's Chart, lab work & pertinent test results, reviewed documented beta blocker date and time   Airway Mallampati: II  TM Distance: >3 FB Neck ROM: full    Dental no notable dental hx. (+) Dental Advisory Given, Teeth Intact   Pulmonary neg pulmonary ROS   Pulmonary exam normal breath sounds clear to auscultation       Cardiovascular Exercise Tolerance: Good negative cardio ROS Normal cardiovascular exam Rhythm:regular Rate:Normal     Neuro/Psych negative neurological ROS  negative psych ROS   GI/Hepatic negative GI ROS, Neg liver ROS,,,  Endo/Other  negative endocrine ROS    Renal/GU negative Renal ROS  negative genitourinary   Musculoskeletal   Abdominal   Peds  Hematology negative hematology ROS (+)   Anesthesia Other Findings   Reproductive/Obstetrics negative OB ROS                             Anesthesia Physical Anesthesia Plan  ASA: 2  Anesthesia Plan: General   Post-op Pain Management: Minimal or no pain anticipated   Induction: Intravenous  PONV Risk Score and Plan: Propofol infusion  Airway Management Planned: Nasal Cannula and Natural Airway  Additional Equipment: None  Intra-op Plan:   Post-operative Plan:   Informed Consent: I have reviewed the patients History and Physical, chart, labs and discussed the procedure including the risks, benefits and alternatives for the proposed anesthesia with the patient or authorized representative who has indicated his/her understanding and acceptance.     Dental Advisory Given  Plan Discussed with: CRNA  Anesthesia Plan Comments:         Anesthesia Quick Evaluation

## 2023-05-16 NOTE — Discharge Instructions (Addendum)
  Colonoscopy Discharge Instructions  Read the instructions outlined below and refer to this sheet in the next few weeks. These discharge instructions provide you with general information on caring for yourself after you leave the hospital. Your doctor may also give you specific instructions. While your treatment has been planned according to the most current medical practices available, unavoidable complications occasionally occur.   ACTIVITY You may resume your regular activity, but move at a slower pace for the next 24 hours.  Take frequent rest periods for the next 24 hours.  Walking will help get rid of the air and reduce the bloated feeling in your belly (abdomen).  No driving for 24 hours (because of the medicine (anesthesia) used during the test).   Do not sign any important legal documents or operate any machinery for 24 hours (because of the anesthesia used during the test).  NUTRITION Drink plenty of fluids.  You may resume your normal diet as instructed by your doctor.  Begin with a light meal and progress to your normal diet. Heavy or fried foods are harder to digest and may make you feel sick to your stomach (nauseated).  Avoid alcoholic beverages for 24 hours or as instructed.  MEDICATIONS You may resume your normal medications unless your doctor tells you otherwise.  WHAT YOU CAN EXPECT TODAY Some feelings of bloating in the abdomen.  Passage of more gas than usual.  Spotting of blood in your stool or on the toilet paper.  IF YOU HAD POLYPS REMOVED DURING THE COLONOSCOPY: No aspirin products for 7 days or as instructed.  No alcohol for 7 days or as instructed.  Eat a soft diet for the next 24 hours.  FINDING OUT THE RESULTS OF YOUR TEST Not all test results are available during your visit. If your test results are not back during the visit, make an appointment with your caregiver to find out the results. Do not assume everything is normal if you have not heard from your  caregiver or the medical facility. It is important for you to follow up on all of your test results.  SEEK IMMEDIATE MEDICAL ATTENTION IF: You have more than a spotting of blood in your stool.  Your belly is swollen (abdominal distention).  You are nauseated or vomiting.  You have a temperature over 101.  You have abdominal pain or discomfort that is severe or gets worse throughout the day.   Your colonoscopy revealed 4 polyp(s) which I removed successfully. Await pathology results, my office will contact you. I recommend repeating colonoscopy in 5 years for surveillance purposes.   You also have diverticulosis and internal hemorrhoids. I would recommend increasing fiber in your diet or adding OTC Benefiber/Metamucil. Be sure to drink at least 4 to 6 glasses of water daily. Follow-up with GI in 8 weeks.   I hope you have a great rest of your week!  Hennie Duos. Marletta Lor, D.O. Gastroenterology and Hepatology [facility]

## 2023-05-16 NOTE — Anesthesia Procedure Notes (Signed)
 Date/Time: 05/16/2023 12:25 PM  Performed by: Franco Nones, CRNAPre-anesthesia Checklist: Patient identified, Emergency Drugs available, Suction available, Timeout performed and Patient being monitored Patient Re-evaluated:Patient Re-evaluated prior to induction Oxygen Delivery Method: Nasal Cannula

## 2023-05-16 NOTE — Interval H&P Note (Signed)
 History and Physical Interval Note:  05/16/2023 11:50 AM  Clifford Hamilton  has presented today for surgery, with the diagnosis of LLQ,FHX: colon cancer,rectal bleeding,rectal pain.  The various methods of treatment have been discussed with the patient and family. After consideration of risks, benefits and other options for treatment, the patient has consented to  Procedure(s) with comments: COLONOSCOPY (N/A) - 12:45 pm, asa 2 as a surgical intervention.  The patient's history has been reviewed, patient examined, no change in status, stable for surgery.  I have reviewed the patient's chart and labs.  Questions were answered to the patient's satisfaction.     Lanelle Bal

## 2023-05-17 ENCOUNTER — Encounter (HOSPITAL_COMMUNITY): Payer: Self-pay | Admitting: Internal Medicine

## 2023-05-17 LAB — SURGICAL PATHOLOGY

## 2023-05-27 DIAGNOSIS — Z419 Encounter for procedure for purposes other than remedying health state, unspecified: Secondary | ICD-10-CM | POA: Diagnosis not present

## 2023-06-26 DIAGNOSIS — Z419 Encounter for procedure for purposes other than remedying health state, unspecified: Secondary | ICD-10-CM | POA: Diagnosis not present

## 2023-07-11 ENCOUNTER — Encounter: Payer: Self-pay | Admitting: Gastroenterology

## 2023-07-11 ENCOUNTER — Ambulatory Visit (INDEPENDENT_AMBULATORY_CARE_PROVIDER_SITE_OTHER): Admitting: Gastroenterology

## 2023-07-11 VITALS — BP 129/84 | HR 60 | Temp 97.8°F | Ht 72.0 in | Wt 241.2 lb

## 2023-07-11 DIAGNOSIS — K6289 Other specified diseases of anus and rectum: Secondary | ICD-10-CM | POA: Diagnosis not present

## 2023-07-11 DIAGNOSIS — K625 Hemorrhage of anus and rectum: Secondary | ICD-10-CM

## 2023-07-11 DIAGNOSIS — K5792 Diverticulitis of intestine, part unspecified, without perforation or abscess without bleeding: Secondary | ICD-10-CM

## 2023-07-11 DIAGNOSIS — Z83719 Family history of colon polyps, unspecified: Secondary | ICD-10-CM | POA: Diagnosis not present

## 2023-07-11 DIAGNOSIS — R1032 Left lower quadrant pain: Secondary | ICD-10-CM

## 2023-07-11 DIAGNOSIS — Z860101 Personal history of adenomatous and serrated colon polyps: Secondary | ICD-10-CM | POA: Diagnosis not present

## 2023-07-11 DIAGNOSIS — K649 Unspecified hemorrhoids: Secondary | ICD-10-CM

## 2023-07-11 NOTE — Progress Notes (Signed)
 GI Office Note    Referring Provider: No ref. provider found Primary Care Physician:  Patient, No Pcp Per Primary Gastroenterologist: Rolando Cliche. Mordechai April, DO  Date:  07/11/2023  ID:  Clifford Hamilton, DOB 1965/11/14, MRN [id number]   Chief Complaint   Chief Complaint  Patient presents with   Follow-up    Follow up from hospital visit. Had diverticulitis. Feeling better.    History of Present Illness  Clifford Hamilton is a 58 y.o. male with a history of diverticulitis and hemorrhoids presenting today with for follow-up post colonoscopy.  ED 04/19/2023 with left-sided abdominal pain and left flank pain occurring for 3 days, worse with certain movements.  Denies nausea or vomiting.  Noted some diarrhea but that is an ongoing issue for him.  Underwent CT scan as noted below.  Discharged with Augmentin  and given dicyclomine  for abdominal pain.   CT A/P 04/19/2023 impression: -Mild diverticulitis involving distal descending colon -Cholelithiasis (2 cm within the gallbladder lumen) -Appendix surgically absent  Last office visit 05/01/23 for diverticulitis ED follow up. Scheduled for colonoscopy.   CT A/P 05/03/23 IMPRESSION: -Colonic diverticulosis with minimal residual inflammatory stranding along the sigmoid colon, suggestive of resolving diverticulitis. -Gastric antral wall thickening, nonspecific but can be seen in the setting of gastritis. - Cholelithiasis without evidence of acute cholecystitis. - Aortic atherosclerosis. - Advised famotidine for gastritis  Colonoscopy 05/16/23: - Nonbleeding internal hemorrhoids - Multiple large small bowel diverticula in the sigmoid, descending, transverse, and ascending colon - 2 mm polyp in the ascending colon - 2 sessile polyps in the transverse colon 4-5 mm - 8 mm pedunculated polyp in the descending colon - Localized area of hyperemic mucosa in the sigmoid colon, likely resolving diverticulitis versus prep artifact - Path: Ascending polyp with  prominent lymphoid aggregate, transverse and descending polyps tubular adenomas - Repeat colonoscopy in 5 years   Today:  Having back pain now - states he has previously had a referral to see a spine specialist, about every year this time he has a bout of abdominal pain. States he has been told in  the past he had a bulging disc. Abdominal pain is better and is doing well with dicyclomine .   Denies any flares of of diverticulitis or pain. When he was feeling bad previously he had cut back on intake. Has lost about 20+ pounds since early March but has been working on portion control. Working on cutting out carbs as much as possible.   Not using hemorrhoid cream - still having some intermittent rectal bleeding. Has more bleeding during the summer.    Wt Readings from Last 3 Encounters:  07/11/23 241 lb 3.2 oz (109.4 kg)  05/16/23 242 lb (109.8 kg)  05/01/23 246 lb 9.6 oz (111.9 kg)    Current Outpatient Medications  Medication Sig Dispense Refill   dicyclomine  (BENTYL ) 20 MG tablet Take 1 tablet (20 mg total) by mouth 2 (two) times daily. 30 tablet 1   No current facility-administered medications for this visit.    Past Medical History:  Diagnosis Date   Arthritis     Past Surgical History:  Procedure Laterality Date   APPENDECTOMY     COLONOSCOPY N/A 05/16/2023   Procedure: COLONOSCOPY;  Surgeon: Vinetta Greening, DO;  [facility];  Service: Endoscopy;  Laterality: N/A;  12:45 pm, asa 2   POLYPECTOMY  05/16/2023   Procedure: POLYPECTOMY;  Surgeon: Vinetta Greening, DO;  Location: [facility];  Service: Endoscopy;;    Family  History  Problem Relation Age of Onset   Cancer Father     Allergies as of 07/11/2023   (No Known Allergies)    Social History   Socioeconomic History   Marital status: Married    Spouse name: Not on file   Number of children: Not on file   Years of education: Not on file   Highest education level: Not on file  Occupational  History   Not on file  Tobacco Use   Smoking status: Never    Passive exposure: Never   Smokeless tobacco: Never  Vaping Use   Vaping status: Never Used  Substance and Sexual Activity   Alcohol use: Never   Drug use: Yes    Types: Marijuana    Comment: last use was yesterday   Sexual activity: Yes  Other Topics Concern   Not on file  Social History Narrative   Not on file   Social Drivers of Health   Financial Resource Strain: Not on file  Food Insecurity: Not on file  Transportation Needs: Not on file  Physical Activity: Not on file  Stress: Not on file  Social Connections: Not on file     Review of Systems   Gen: Denies fever, chills, anorexia. Denies fatigue, weakness, weight loss.  CV: Denies chest pain, palpitations, syncope, peripheral edema, and claudication. Resp: Denies dyspnea at rest, cough, wheezing, coughing up blood, and pleurisy. GI: See HPI Derm: Denies rash, itching, dry skin Psych: Denies depression, anxiety, memory loss, confusion. No homicidal or suicidal ideation.  Heme: Denies bruising, bleeding, and enlarged lymph nodes.  Physical Exam   BP 129/84 (BP Location: Right Arm, Patient Position: Sitting, Cuff Size: Large)   Pulse 60   Temp 97.8 F (36.6 C) (Temporal)   Ht 6' (1.829 m)   Wt 241 lb 3.2 oz (109.4 kg)   BMI 32.71 kg/m   General:   Alert and oriented. No distress noted. Pleasant and cooperative.  Head:  Normocephalic and atraumatic. Eyes:  Conjuctiva clear without scleral icterus. Mouth:  Oral mucosa pink and moist. Good dentition. No lesions. Rectal: deferred Msk:  Symmetrical without gross deformities. Normal posture. Extremities:  Without edema. Neurologic:  Alert and  oriented x4 Psych:  Alert and cooperative. Normal mood and affect.  Assessment  Brnadon Hamilton is a 58 y.o. male with a history of hemorrhoids and diverticulitis presenting today for follow-up post colonoscopy.  Diverticulitis, left-sided abdominal pain:  After last visit he had evidence of resolving diverticulitis on CT scan.  Colonoscopy performed April 1 also with evidence of hyperemia likely representing resolving diverticulitis as well as evidence of large and small diverticuli as well as multiple polyps removed.  Has been doing well from an abdominal pain standpoint with dicyclomine  1-2 times a day as needed.  Will continue to give this as needed, advised to stop scheduled use and trial only as needed.  Rectal bleeding and rectal pain: Continues to have rectal bleeding and some occasional rectal pain but more commonly happens during summertime and lessens during the wintertime.  Likely has a lot to do with activity.  No other possible etiology on colonoscopy for rectal bleeding other than internal hemorrhoids.  Discussed option for banding today in the office and provided pamphlet.  He is interested in having this performed.  Low risk given he does not take any blood thinners.  Bleeding has been going on for many years and he is willing to start some treatment.  For now also advised Preparation  H twice daily for at least a week and then to use as needed to help with rectal bleeding and hemorrhoids.  Has not tried any over-the-counter measures previously.  Family history of colon cancer, history of colon polyps: Family history of colon cancer in his father as well as paternal grandfather.  Most recent colonoscopy as noted above in HPI but with multiple tubular adenomas removed.  Will plan for repeat in 5 years.  PLAN   Dicyclomine  1-2 times daily as needed.  Hemorrhoid banding discussed, pamphlet provided. Preparation H BID for 1 week then as needed High fiber diet Follow up for banding when preferred Given recommendations for primary care including [facility] as well as [facility].   Julian Obey, MSN, FNP-BC, AGACNP-BC [facility]

## 2023-07-11 NOTE — Patient Instructions (Addendum)
 I want you to use Preparation H twice daily for about 1 week and then use as needed for your hemorrhoids and rectal bleeding.  Continue using the dicyclomine  but I want you to try reducing this to only as needed.  If you continue to have abdominal pain despite resolution of your diverticulitis then you may continue to use the dicyclomine .  Follow a high-fiber diet to help prevent recurrence of diverticulitis.  Please make a banding appointment whenever you would like to help treat your hemorrhoids.  It was a pleasure to see you today. I want to create trusting relationships with patients. If you receive a survey regarding your visit,  I greatly appreciate you taking time to fill this out on paper or through your MyChart. I value your feedback.  Julian Obey, MSN, FNP-BC, AGACNP-BC [facility]

## 2023-07-27 DIAGNOSIS — Z419 Encounter for procedure for purposes other than remedying health state, unspecified: Secondary | ICD-10-CM | POA: Diagnosis not present

## 2023-08-14 ENCOUNTER — Encounter: Payer: Self-pay | Admitting: Orthopedic Surgery

## 2023-08-14 ENCOUNTER — Other Ambulatory Visit (INDEPENDENT_AMBULATORY_CARE_PROVIDER_SITE_OTHER)

## 2023-08-14 ENCOUNTER — Ambulatory Visit: Admitting: Orthopedic Surgery

## 2023-08-14 VITALS — BP 139/80 | HR 92 | Ht 72.0 in | Wt 241.0 lb

## 2023-08-14 DIAGNOSIS — M545 Low back pain, unspecified: Secondary | ICD-10-CM

## 2023-08-14 DIAGNOSIS — M25552 Pain in left hip: Secondary | ICD-10-CM

## 2023-08-14 DIAGNOSIS — M25551 Pain in right hip: Secondary | ICD-10-CM | POA: Diagnosis not present

## 2023-08-14 MED ORDER — TIZANIDINE HCL 4 MG PO TABS: 4.0000 mg | ORAL_TABLET | Freq: Four times a day (QID) | ORAL | 0 refills | Status: AC | PRN

## 2023-08-14 NOTE — Progress Notes (Signed)
  Intake history:  BP 139/80   Pulse 92   Ht 6' (1.829 m)   Wt 241 lb (109.3 kg)   BMI 32.69 kg/m  Body mass index is 32.69 kg/m.    WHAT ARE WE SEEING YOU FOR TODAY?   back - lumbar/sacral  Radiation?: yes - into both legs and both groin area.   Loss of bowel/urine control?  no  How long has this bothered you? (DOI?DOS?WS?)  approximately 10 years(s) ago would have a flare once a year now pain is constant   Anticoag.  No  Diabetes No  Heart disease No  Hypertension No  SMOKING HX No  Kidney disease No  Any ALLERGIES ______________________No Known Allergies ________________________   Treatment:  Have you taken:  Tylenol  Yes  Advil Yes  Had PT No  Had injection No  Other  _________________________

## 2023-08-14 NOTE — Patient Instructions (Signed)
 If pain continues, call [facility] for Follow up

## 2023-08-14 NOTE — Progress Notes (Signed)
 Chief Complaint  Patient presents with   Back Pain    Clifford Hamilton is a 58 year old male whose had back pain for years  He has had frequent intermittent episodes of back pain which he was able to manage without any treatment  However lately he has had constant back pain unrelieved by Tylenol  and ibuprofen associated with stiffness and pain when he is lying in the recumbent position  He had an episode of right leg radiculopathy which has subsequently improved probably 90%  He presents today for management of his back pain  Past Medical History:  Diagnosis Date   Arthritis    Past Surgical History:  Procedure Laterality Date   APPENDECTOMY     COLONOSCOPY N/A 05/16/2023   Procedure: COLONOSCOPY;  Surgeon: Cindie Carlin POUR, DO;  [facility];  Service: Endoscopy;  Laterality: N/A;  12:45 pm, asa 2   POLYPECTOMY  05/16/2023   Procedure: POLYPECTOMY;  Surgeon: Cindie Carlin POUR, DO;  [facility];  Service: Endoscopy;;    Physical Exam Vitals and nursing note reviewed.  Constitutional:      Appearance: Normal appearance.  HENT:     Head: Normocephalic and atraumatic.   Eyes:     General: No scleral icterus.       Right eye: No discharge.        Left eye: No discharge.     Extraocular Movements: Extraocular movements intact.     Conjunctiva/sclera: Conjunctivae normal.     Pupils: Pupils are equal, round, and reactive to light.    Cardiovascular:     Rate and Rhythm: Normal rate.     Pulses: Normal pulses.   Musculoskeletal:     Comments: In the supine position he has a leg length discrepancy which persist even in the standing position with the left hip higher than the right on x-ray this is shown by a gentle curve in his thoracolumbar spine  He is tender in his lower back he has normal range of motion in both hips  Normal reflexes in each leg  Normal motor function in each leg  Normal sensation in each leg   Skin:    General: Skin is warm and dry.      Capillary Refill: Capillary refill takes less than 2 seconds.   Neurological:     General: No focal deficit present.     Mental Status: He is alert and oriented to person, place, and time.   Psychiatric:        Mood and Affect: Mood normal.        Behavior: Behavior normal.        Thought Content: Thought content normal.        Judgment: Judgment normal.    Encounter Diagnoses  Name Primary?   Lumbar pain Yes   Bilateral hip pain     I think a course of physical therapy and adding a muscle relaxer to his ibuprofen and Tylenol  is about as much as I can recommend for him at this time  It does not appear to need surgery  Meds ordered this encounter  Medications   tiZANidine (ZANAFLEX) 4 MG tablet    Sig: Take 1 tablet (4 mg total) by mouth every 6 (six) hours as needed for muscle spasms.    Dispense:  30 tablet    Refill:  0     If he does not get better after 4 weeks of physical therapy  He can follow-up with [facility]o care [facility]  for a more complete examination of his spine and further recommendations Intake history:  BP 139/80   Pulse 92   Ht 6' (1.829 m)   Wt 241 lb (109.3 kg)   BMI 32.69 kg/m  Body mass index is 32.69 kg/m.    WHAT ARE WE SEEING YOU FOR TODAY?   back - lumbar/sacral  Radiation?: yes - into both legs and both groin area.   Loss of bowel/urine control?  no  How long has this bothered you? (DOI?DOS?WS?)  approximately 10 years(s) ago would have a flare once a year now pain is constant   Anticoag.  No  Diabetes No  Heart disease No  Hypertension No  SMOKING HX No  Kidney disease No  Any ALLERGIES ______________________No Known Allergies ________________________   Treatment:  Have you taken:  Tylenol  Yes  Advil Yes  Had PT No  Had injection No  Other  _________________________

## 2023-08-26 DIAGNOSIS — Z419 Encounter for procedure for purposes other than remedying health state, unspecified: Secondary | ICD-10-CM | POA: Diagnosis not present

## 2023-09-22 ENCOUNTER — Ambulatory Visit (HOSPITAL_COMMUNITY)

## 2023-09-22 NOTE — Therapy (Incomplete)
 OUTPATIENT PHYSICAL THERAPY THORACOLUMBAR EVALUATION   Patient Name: Clifford Hamilton MRN: [id number] DOB:19-Oct-1965, 58 y.o., male Today's Date: 09/22/2023  END OF SESSION:   Past Medical History:  Diagnosis Date   Arthritis    Past Surgical History:  Procedure Laterality Date   APPENDECTOMY     COLONOSCOPY N/A 05/16/2023   Procedure: COLONOSCOPY;  Surgeon: Cindie Carlin POUR, DO;  [facility];  Service: Endoscopy;  Laterality: N/A;  12:45 pm, asa 2   POLYPECTOMY  05/16/2023   Procedure: POLYPECTOMY;  Surgeon: Cindie Carlin POUR, DO;  [facility];  Service: Endoscopy;;   There are no active problems to display for this patient.   PCP: No PCP  REFERRING PROVIDER: Margrette Taft BRAVO, MD  REFERRING DIAG: M54.50 (ICD-10-CM) - Lumbar pain M25.551,M25.552 (ICD-10-CM) - Bilateral hip pain  Rationale for Evaluation and Treatment: Rehabilitation  THERAPY DIAG:  No diagnosis found.  ONSET DATE: ***  SUBJECTIVE:                                                                                                                                                                                           SUBJECTIVE STATEMENT: ***  PERTINENT HISTORY:  ***  PAIN:  Are you having pain? {OPRCPAIN:27236}  PRECAUTIONS: {Therapy precautions:24002}  RED FLAGS: {PT Red Flags:29287}   WEIGHT BEARING RESTRICTIONS: {Yes ***/No:24003}  FALLS:  Has patient fallen in last 6 months? {fallsyesno:27318}  LIVING ENVIRONMENT: Lives with: {OPRC lives with:25569::lives with their family} Lives in: {Lives in:25570} Stairs: {opstairs:27293} Has following equipment at home: {Assistive devices:23999}  OCCUPATION: ***  PLOF: {PLOF:24004}  PATIENT GOALS: ***  NEXT MD VISIT: ***  OBJECTIVE:  Note: Objective measures were completed at Evaluation unless otherwise noted.  DIAGNOSTIC FINDINGS:  Back pain   Spine x-ray   3 views   X-rays show a gentle curve to the right  lumbar spine normal lordosis.  Small endplate irregularities at L3-4   There may be some evidence of facet arthritis.   No evidence of spondylolysis or listhesis   Impression mild spondylosis lumbar spine   AP pelvis to evaluate bilateral hip pain associated with lower back pain   X-rays of the pelvis show normal x-rays of both hips no signs of avascular necrosis or femoral acetabular impingement   Impression normal hips on the pelvic x-ray  PATIENT SURVEYS:  Modified Oswestry: {:PHR,OPRCODI}  COGNITION: Overall cognitive status: {cognition:24006}     SENSATION: {sensation:27233}  MUSCLE LENGTH: Hamstrings: Right *** deg; Left *** deg Thomas test: Right *** deg; Left *** deg  POSTURE: {posture:25561}  PALPATION: ***  LUMBAR ROM:   AROM eval  Flexion  Extension   Right lateral flexion   Left lateral flexion   Right rotation   Left rotation    (Blank rows = not tested)  LOWER EXTREMITY ROM:     {AROM/PROM:27142}  Right eval Left eval  Hip flexion    Hip extension    Hip abduction    Hip adduction    Hip internal rotation    Hip external rotation    Knee flexion    Knee extension    Ankle dorsiflexion    Ankle plantarflexion    Ankle inversion    Ankle eversion     (Blank rows = not tested)  LOWER EXTREMITY MMT:    MMT Right eval Left eval  Hip flexion    Hip extension    Hip abduction    Hip adduction    Hip internal rotation    Hip external rotation    Knee flexion    Knee extension    Ankle dorsiflexion    Ankle plantarflexion    Ankle inversion    Ankle eversion     (Blank rows = not tested)  LUMBAR SPECIAL TESTS:  {lumbar special test:25242}  FUNCTIONAL TESTS:  5 times sit to stand: *** 2 minute walk test: ***  GAIT: Distance walked: *** Assistive device utilized: {Assistive devices:23999} Level of assistance: {Levels of assistance:24026} Comments: ***  TREATMENT DATE:  09/22/2023  Vitals: BP: HR: O2  sat: Evaluation: -ROM measured, Strength assessed, HEP prescribed, pt educated on prognosis, findings, and importance of HEP compliance if given.  Manual Therapy: -CPA of Lumbar Spinal segments L3-L5, grade II-III mobilizations -STM of Lumbar Paraspinal musculature  Therapeutic Exercise: -Supine bridges 2 sets of 10 reps, 3 second holds, symptomatic, pt cued for max hip extension -Standing 3 way hip 1 sets 10 reps, bilaterally, pt cued for upright trunk and maintaining of neutral spine -Lateral stepping 3 laps 20 feet per lap, second 2 with RTB around ankles, pt cued for upright posture -Forward lunges, 1 set of 5 reps better performance going into RLE, pt cued for core activation and upright posture                                                                                                                                    PATIENT EDUCATION:  Education details: Pt was educated on findings of PT evaluation, prognosis, frequency of therapy visits and rationale, attendance policy, and HEP if given.   Person educated: {Person educated:25204} Education method: {Education Method:25205} Education comprehension: {Education Comprehension:25206}  HOME EXERCISE PROGRAM: ***  ASSESSMENT:  CLINICAL IMPRESSION: Patient is a 58 y.o. male who was seen today for physical therapy evaluation and treatment for M54.50 (ICD-10-CM) - Lumbar pain M25.551,M25.552 (ICD-10-CM) - Bilateral hip pain.   Patient demonstrates decreased LE strength, abnormal pain rating, and impaired balance. Patient also demonstrates difficulty with ambulation during today's session with decreased stride length and velocity noted. Patient also demonstrates ***.  Patient requires ***. Patient would benefit from skilled physical therapy for increased endurance with ambulation, increased LE strength, and balance for improved gait quality, return to higher level of function with ADLs, and progress towards therapy  goals.  OBJECTIVE IMPAIRMENTS: {opptimpairments:25111}.   ACTIVITY LIMITATIONS: {activitylimitations:27494}  PARTICIPATION LIMITATIONS: {participationrestrictions:25113}  PERSONAL FACTORS: {Personal factors:25162} are also affecting patient's functional outcome.   REHAB POTENTIAL: {rehabpotential:25112}  CLINICAL DECISION MAKING: {clinical decision making:25114}  EVALUATION COMPLEXITY: {Evaluation complexity:25115}   GOALS: Goals reviewed with patient? {yes/no:20286}  SHORT TERM GOALS: Target date: ***  Pt will be independent with HEP in order to demonstrate participation in Physical Therapy POC.  Baseline: Goal status: {GOALSTATUS:25110}  2.  Pt will report ***/10 pain with mobility in order to demonstrate improved pain with ADLs.  Baseline:  Goal status: {GOALSTATUS:25110}  LONG TERM GOALS: Target date: ***  Pt will improve *** by *** in order to demonstrate improved functional strength to return to desired activities.  Baseline: see objective.  Goal status: {GOALSTATUS:25110}  2.  Pt will improve 2 MWT by *** in order to demonstrate improved functional ambulatory capacity in community setting.  Baseline: see objective.  Goal status: {GOALSTATUS:25110}  3.  Pt will improve Modified Oswestry score by *** in order to demonstrate improved pain with functional goals and outcomes. Baseline: see objective.  Goal status: {GOALSTATUS:25110}  4.  Pt will report ***/10 pain with mobility in order to demonstrate reduced pain with ADLs lasting greater than 30 minutes.  Baseline: see objective.  Goal status: {GOALSTATUS:25110}   PLAN:  PT FREQUENCY: {rehab frequency:25116}  PT DURATION: {rehab duration:25117}  PLANNED INTERVENTIONS: {rehab planned interventions:25118::97110-Therapeutic exercises,97530- Therapeutic [phone]- Neuromuscular re-education,97535- Self Rjmz,02859- Manual therapy}.  PLAN FOR NEXT SESSION: ***   Lang Ada, PT, DPT [facility] Office: [phone] 7:42 AM, 09/22/23

## 2023-09-25 ENCOUNTER — Ambulatory Visit: Admitting: Orthopedic Surgery

## 2023-09-26 DIAGNOSIS — Z419 Encounter for procedure for purposes other than remedying health state, unspecified: Secondary | ICD-10-CM | POA: Diagnosis not present

## 2023-10-27 DIAGNOSIS — Z419 Encounter for procedure for purposes other than remedying health state, unspecified: Secondary | ICD-10-CM | POA: Diagnosis not present

## 2023-10-30 NOTE — Therapy (Signed)
 OUTPATIENT PHYSICAL THERAPY THORACOLUMBAR EVALUATION   Patient Name: Clifford Hamilton MRN: [id number] DOB:1965/12/04, 58 y.o., male Today's Date: 10/31/2023  END OF SESSION:  PT End of Session - 10/31/23 0855     Visit Number 1    Number of Visits 8    Date for PT Re-Evaluation 11/28/23    Authorization Type [place] [employer]    Authorization Time Period please check auth    PT Start Time 0850    PT Stop Time 0928    PT Time Calculation (min) 38 min    Activity Tolerance Patient tolerated treatment well    Behavior During Therapy [facility] for tasks assessed/performed          Past Medical History:  Diagnosis Date   Arthritis    Past Surgical History:  Procedure Laterality Date   APPENDECTOMY     COLONOSCOPY N/A 05/16/2023   Procedure: COLONOSCOPY;  Surgeon: Cindie Carlin POUR, DO;  [facility];  Service: Endoscopy;  Laterality: N/A;  12:45 pm, asa 2   POLYPECTOMY  05/16/2023   Procedure: POLYPECTOMY;  Surgeon: Cindie Carlin POUR, DO;  Location[facility];  Service: Endoscopy;;   There are no active problems to display for this patient.   PCP: none  REFERRING PROVIDER: Margrette Taft BRAVO, MD REFERRING DIAG: M54.50 (ICD-10-CM) - Lumbar pain M25.551,M25.552 (ICD-10-CM) - Bilateral hip painM54.50 (ICD-10-CM) Rationale for Evaluation and Treatment: Rehabilitation  THERAPY DIAG:  Low back pain, unspecified back pain laterality, unspecified chronicity, unspecified whether sciatica present - Plan: PT plan of care cert/re-cert  Difficulty in walking, not elsewhere classified - Plan: PT plan of care cert/re-cert  Pain in right hip - Plan: PT plan of care cert/re-cert  ONSET DATE: chronic  SUBJECTIVE:                                                                                                                                                                                           SUBJECTIVE STATEMENT: Margrette told him he had Leg length discrepancy; he did get  an insert and that does help; right leg is shorter; back pain is chronic every August his back seems to go out; (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  Will lay flat for about a week and then he will get better.  Saw Dr. Margrette when it happened this year.  X-ray's showed some arthritis in his low back; pain is low back and down right leg  PERTINENT HISTORY:  Recently had a bad tooth infection; had to have teeth removed and is still sore  PAIN:  Are you having  pain? 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 No   OCCUPATION: outside work, farming  PLOF: Independent  PATIENT GOALS: get stronger  NEXT MD VISIT: PRN  OBJECTIVE:  Note: Objective measures were completed at Evaluation unless otherwise noted.  DIAGNOSTIC FINDINGS:  Back pain   Spine x-ray   3 views   X-rays show a gentle curve to the right lumbar spine normal lordosis.  Small endplate irregularities at L3-4   There may be some evidence of facet arthritis.   No evidence of spondylolysis or listhesis   Impression mild spondylosis lumbar spine  AP pelvis to evaluate bilateral hip pain associated with lower back pain   X-rays of the pelvis show normal x-rays of both hips no signs of avascular necrosis or femoral acetabular impingement   Impression normal hips on the pelvic x-ray   PATIENT SURVEYS:  Modified Oswestry:  MODIFIED OSWESTRY DISABILITY SCALE  Date: 10/31/23 Score                                Total 34/50; 68%   Interpretation of scores: Score Category Description  0-20% Minimal Disability The patient can cope with most living activities. Usually no treatment is  indicated apart from advice on lifting, sitting and exercise  21-40% Moderate Disability The patient experiences more pain and difficulty with sitting, lifting and standing. Travel and social life are more difficult and they may be disabled from work. Personal care, sexual activity and sleeping are not grossly affected, and the patient can usually be managed by conservative means  41-60% Severe Disability Pain remains the main problem in this group, but activities of daily living are affected. These patients require a detailed investigation  61-80% Crippled Back pain impinges on all aspects of the patient's life. Positive intervention is required  81-100% Bed-bound  These patients are either bed-bound or exaggerating their symptoms  Bluford FORBES Zoe DELENA Karon DELENA, et al. Surgery versus conservative management of stable thoracolumbar fracture: the PRESTO feasibility RCT. [place] ([place]): [employer]; 2021 Nov. [facility] Technology Assessment, No. 25.62.) Appendix 3, Oswestry Disability Index category descriptors. Available from: FindJewelers.cz  Minimally Clinically Important Difference (MCID) = 12.8%  COGNITION: Overall cognitive status: Within functional limits for tasks assessed     SENSATION: Reports numbness and tingling down front of right leg   MUSCLE LENGTH: Hamstrings: stretch hamstrings  POSTURE: rounded shoulders and forward head  PALPATION: Tender mid back  LUMBAR ROM: * pain  AROM eval  Flexion Fingertips to mid shin *  Extension 40% available; less painful  Right lateral flexion   Left lateral flexion   Right rotation   Left rotation    (Blank rows = not tested)  LOWER EXTREMITY ROM:     Active  Right eval Left eval  Hip flexion    Hip extension    Hip abduction    Hip adduction    Hip internal rotation    Hip external rotation    Knee flexion    Knee extension    Ankle dorsiflexion    Ankle plantarflexion    Ankle  inversion    Ankle eversion     (  Blank rows = not tested)  LOWER EXTREMITY MMT:    MMT Right eval Left eval  Hip flexion 4- 4+  Hip extension 3+ 4+  Hip abduction    Hip adduction    Hip internal rotation    Hip external rotation    Knee flexion 4- 5  Knee extension 4- 5  Ankle dorsiflexion 4- 5  Ankle plantarflexion    Ankle inversion    Ankle eversion     (Blank rows = not tested)  FUNCTIONAL TESTS:  5 times sit to stand: 33.04 sec using hands to assist  GAIT: Distance walked: 60 ft in clinic Assistive device utilized: None Level of assistance: Modified independence Comments: antalgic gait; external rotation of right lower extremity  TREATMENT DATE: 10/31/23 physical therapy evaluation and HEP instruction                                                                                                                                 PATIENT EDUCATION:  Education details: Patient educated on exam findings, POC, scope of PT, HEP, and what to expect next visit. Person educated: Patient Education method: Explanation, Demonstration, and Handouts Education comprehension: verbalized understanding, returned demonstration, verbal cues required, and tactile cues required  HOME EXERCISE PROGRAM: Access Code: [email or web address] URL: [email or web address] Date: 10/31/2023 Prepared by: [facility] - Rehab  Exercises - Supine Transversus Abdominis Bracing - Hands on Stomach  - 2 x daily - 7 x weekly - 1 sets - 10 reps - 5 sec hold - Prone on Elbows Stretch  - 2 x daily - 7 x weekly - 1 sets - 1 reps - 5 min hold - Standing Lumbar Extension  - 2 x daily - 7 x weekly - 1 sets - 10 reps - Standing Lumbar Extension with Counter  - 2 x daily - 7 x weekly - 1 sets - 10 reps  ASSESSMENT:  CLINICAL IMPRESSION: Patient is a 58 y.o. male who was seen today for physical therapy evaluation and treatment for M54.50 (ICD-10-CM) - Lumbar pain M25.551,M25.552 (ICD-10-CM) - Bilateral hip pain.  Patient demonstrates muscle weakness, reduced ROM, and fascial restrictions which are likely contributing to symptoms of pain and are negatively impacting patient ability to perform ADLs and functional mobility tasks. Patient will benefit from skilled physical therapy services to address these deficits to reduce pain and improve level of function with ADLs and functional mobility tasks.   OBJECTIVE IMPAIRMENTS: Abnormal gait, decreased activity tolerance, increased fascial restrictions, impaired perceived functional ability, impaired flexibility, and pain.   ACTIVITY LIMITATIONS: lifting, bending, sitting, standing, and sleeping  PARTICIPATION LIMITATIONS: meal prep, cleaning, laundry, driving, occupation, and yard work  [facility] POTENTIAL: Good  CLINICAL DECISION MAKING: Evolving/moderate complexity  EVALUATION COMPLEXITY: Moderate   GOALS: Goals reviewed with patient? No  SHORT TERM GOALS: Target date: 11/14/2023  patient will be independent with initial HEP  Baseline: Goal status: INITIAL  2.  Patient will report 50% improvement  overall  Baseline:  Goal status: INITIAL  LONG TERM GOALS: Target date: 11/28/2023  Patient will be independent in self management strategies to improve quality of life and functional outcomes.   Baseline:  Goal status: INITIAL  2.  Patient will report 70% improvement overall  Baseline:  Goal status: INITIAL  3.  Patient will improve Modified Oswestry score by 10 points to demonstrate improved perceived function  Baseline: 34/50 Goal status: INITIAL  4.  Patient will improve 5 times sit to stand score to 15 sec or less to demonstrate improved functional mobility and increased leg strength.    Baseline:  Goal status: INITIAL  5.   Patient will increase right leg MMT's to 4+ to 5/5 to allow navigation of steps without gait deviation or loss of balance  Baseline: see above Goal status: INITIAL  PLAN:  PT FREQUENCY: 2x/week  PT  DURATION: 4 weeks  PLANNED INTERVENTIONS: 97164- PT Re-evaluation, 97110-Therapeutic exercises, 97530- Therapeutic activity, 97112- Neuromuscular re-education, 97535- Self Care, 02859- Manual therapy, [id number]- Gait training, [phone]- Orthotic Fit/training, [phone]- Canalith repositioning, [id number]- Aquatic Therapy, 97760- Splinting, [id number]- Wound care (first 20 sq cm), 97598- Wound care (each additional 20 sq cm)Patient/Family education, Balance training, Stair training, Taping, Dry Needling, Joint mobilization, Joint manipulation, Spinal manipulation, Spinal mobilization, Scar mobilization, and DME instructions. SABRA  PLAN FOR NEXT SESSION: Review HEP and goals; lumbar mobility and postural strengthening; of note right leg length discrepancy (right leg shorter) encourage consistent use of orthotic; decompression exercises.     9:46 AM, 11/30/23 Enrique Weiss Small Minerva Bluett MPT [facility] [place] [phone] Ph:[phone]   Managed Medicaid Authorization Request Treatment Start Date: Nov 30, 2023  Visit Dx Codes: M54.50, R26.2, M25.551  Functional Tool Score: Modified Oswestry 34/50 68%  For all possible CPT codes, reference the Planned Interventions line above.     Check all conditions that are expected to impact treatment: {Conditions expected to impact treatment:None of these apply   If treatment provided at initial evaluation, no treatment charged due to lack of authorization.

## 2023-10-31 ENCOUNTER — Other Ambulatory Visit: Payer: Self-pay

## 2023-10-31 ENCOUNTER — Ambulatory Visit (HOSPITAL_COMMUNITY): Attending: Orthopedic Surgery

## 2023-10-31 DIAGNOSIS — M545 Low back pain, unspecified: Secondary | ICD-10-CM | POA: Insufficient documentation

## 2023-10-31 DIAGNOSIS — M25552 Pain in left hip: Secondary | ICD-10-CM | POA: Diagnosis not present

## 2023-10-31 DIAGNOSIS — R262 Difficulty in walking, not elsewhere classified: Secondary | ICD-10-CM | POA: Diagnosis not present

## 2023-10-31 DIAGNOSIS — M25551 Pain in right hip: Secondary | ICD-10-CM | POA: Diagnosis not present

## 2023-11-03 ENCOUNTER — Ambulatory Visit (HOSPITAL_COMMUNITY)

## 2023-11-03 DIAGNOSIS — M545 Low back pain, unspecified: Secondary | ICD-10-CM

## 2023-11-03 DIAGNOSIS — M25551 Pain in right hip: Secondary | ICD-10-CM | POA: Diagnosis not present

## 2023-11-03 DIAGNOSIS — R262 Difficulty in walking, not elsewhere classified: Secondary | ICD-10-CM

## 2023-11-03 DIAGNOSIS — M25552 Pain in left hip: Secondary | ICD-10-CM | POA: Diagnosis not present

## 2023-11-03 NOTE — Therapy (Signed)
 OUTPATIENT PHYSICAL THERAPY THORACOLUMBAR TREATMENT   Patient Name: Clifford Hamilton MRN: [id number] DOB:[birth year], 58 y.o., male Today's Date: 11/03/2023  END OF SESSION:  PT End of Session - 11/03/23 0803     Visit Number 2    Number of Visits 8    Date for Recertification  11/28/23    Authorization Type [place] Medicaid Well[employer]    Authorization Time Period wellcare approved 10 visits from 10/31/23-12/30/2023    Authorization - Visit Number 2    Authorization - Number of Visits 10    Progress Note Due on Visit 10    PT Start Time 0804    PT Stop Time 0845    PT Time Calculation (min) 41 min    Activity Tolerance Patient tolerated treatment well    Behavior During Therapy [facility] for tasks assessed/performed          Past Medical History:  Diagnosis Date   Arthritis    Past Surgical History:  Procedure Laterality Date   APPENDECTOMY     COLONOSCOPY N/A 05/16/2023   Procedure: COLONOSCOPY;  Surgeon: Cindie Carlin POUR, DO;  [facility];  Service: Endoscopy;  Laterality: N/A;  12:45 pm, asa 2   POLYPECTOMY  05/16/2023   Procedure: POLYPECTOMY;  Surgeon: Cindie Carlin POUR, DO;  [facility];  Service: Endoscopy;;   There are no active problems to display for this patient.   PCP: none  REFERRING PROVIDER: Margrette Taft BRAVO, MD REFERRING DIAG: M54.50 (ICD-10-CM) - Lumbar pain M25.551,M25.552 (ICD-10-CM) - Bilateral hip painM54.50 (ICD-10-CM) Rationale for Evaluation and Treatment: Rehabilitation  THERAPY DIAG:  Low back pain, unspecified back pain laterality, unspecified chronicity, unspecified whether sciatica present  Difficulty in walking, not elsewhere classified  Pain in right hip  ONSET DATE: chronic  SUBJECTIVE:                                                                                                                                                                                           SUBJECTIVE STATEMENT: Pt reports yesterday he did  a lot at work and didn't feel so good. Reports he's feeling better today. Reports he's not able to sleep that well. Reports he is around a 6/10 today. Reports he's had bouts of where he actually needs help to get out of bed because of back. Reports HEP went well. Reminded him of yoga that he used to do. Reports he was just sore after HEP completion at home.    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  Will lay flat for about a week and then he will get better.  Saw Dr. Margrette when it happened this year.  X-ray's showed some arthritis in his low back; pain is low back and down right leg  PERTINENT HISTORY:  Recently had a bad tooth infection; had to have teeth removed and is still sore  PAIN:  Are you having pain? 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 No   OCCUPATION: outside work, farming  PLOF: Independent  PATIENT GOALS: get stronger  NEXT MD VISIT: PRN  OBJECTIVE:  Note: Objective measures were completed at Evaluation unless otherwise noted.  DIAGNOSTIC FINDINGS:  Back pain   Spine x-ray   3 views   X-rays show a gentle curve to the right lumbar spine normal lordosis.  Small endplate irregularities at L3-4   There may be some evidence of facet arthritis.   No evidence of spondylolysis or listhesis   Impression mild spondylosis lumbar spine  AP pelvis to evaluate bilateral hip pain associated with lower back pain   X-rays of the pelvis show normal x-rays of both hips no  signs of avascular necrosis or femoral acetabular impingement   Impression normal hips on the pelvic x-ray   PATIENT SURVEYS:  Modified Oswestry:  MODIFIED OSWESTRY DISABILITY SCALE  Date: 10/31/23 Score                                Total 34/50; 68%   Interpretation of scores: Score Category Description  0-20% Minimal Disability The patient can cope with most living activities. Usually no treatment is indicated apart from advice on lifting, sitting and exercise  21-40% Moderate Disability The patient experiences more pain and difficulty with sitting, lifting and standing. Travel and social life are more difficult and they may be disabled from work. Personal care, sexual activity and sleeping are not grossly affected, and the patient can usually be managed by conservative means  41-60% Severe Disability Pain remains the main problem in this group, but activities of daily living are affected. These patients require a detailed investigation  61-80% Crippled Back pain impinges on all aspects of the patient's life. Positive intervention is required  81-100% Bed-bound  These patients are either bed-bound or exaggerating their symptoms  Bluford FORBES Zoe DELENA Karon DELENA, et al. Surgery versus conservative management of stable thoracolumbar fracture: the PRESTO feasibility RCT. [place] ([place]): [employer]; 2021 Nov. [facility] Technology Assessment, No. 25.62.) Appendix 3, Oswestry Disability Index category descriptors. Available from: FindJewelers.cz  Minimally Clinically Important Difference (MCID) = 12.8%  COGNITION: Overall cognitive status: Within functional limits for tasks assessed     SENSATION: Reports numbness and tingling down front of right leg   MUSCLE LENGTH: Hamstrings: stretch hamstrings  POSTURE: rounded shoulders and forward head  PALPATION: Tender mid back  LUMBAR ROM: * pain  AROM eval  Flexion Fingertips to mid shin *   Extension 40% available; less painful  Right lateral flexion   Left lateral flexion   Right rotation  Left rotation    (Blank rows = not tested)  LOWER EXTREMITY ROM:     Active  Right eval Left eval  Hip flexion    Hip extension    Hip abduction    Hip adduction    Hip internal rotation    Hip external rotation    Knee flexion    Knee extension    Ankle dorsiflexion    Ankle plantarflexion    Ankle inversion    Ankle eversion     (Blank rows = not tested)  LOWER EXTREMITY MMT:    MMT Right eval Left eval  Hip flexion 4- 4+  Hip extension 3+ 4+  Hip abduction    Hip adduction    Hip internal rotation    Hip external rotation    Knee flexion 4- 5  Knee extension 4- 5  Ankle dorsiflexion 4- 5  Ankle plantarflexion    Ankle inversion    Ankle eversion     (Blank rows = not tested)  FUNCTIONAL TESTS:  5 times sit to stand: 33.04 sec using hands to assist  GAIT: Distance walked: 60 ft in clinic Assistive device utilized: None Level of assistance: Modified independence Comments: antalgic gait; external rotation of right lower extremity  TREATMENT DATE:  11/03/23: Review of goals Review of HEP: Standing extension at counter, 5 holds, 10x POE-->cobra/upward dog, 2' Cat/cow, 1', mild discomfort Modified Thomas stretch w/ MHP donned,  2x30 each side Decompression exercises 1-3 w/ MHP donned and wedge under LE, 10x each, 5 holds   10/31/23 physical therapy evaluation and HEP instruction                                                                                                                                 PATIENT EDUCATION:  Education details: Patient educated on exam findings, POC, scope of PT, HEP, and what to expect next visit. Person educated: Patient Education method: Explanation, Demonstration, and Handouts Education comprehension: verbalized understanding, returned demonstration, verbal cues required, and tactile cues required  HOME  EXERCISE PROGRAM: Access Code: [email or web address] URL: [email or web address] Date: 10/31/2023 Prepared by: [facility] - Rehab  Exercises - Supine Transversus Abdominis Bracing - Hands on Stomach  - 2 x daily - 7 x weekly - 1 sets - 10 reps - 5 sec hold - Prone on Elbows Stretch  - 2 x daily - 7 x weekly - 1 sets - 1 reps - 5 min hold - Standing Lumbar Extension  - 2 x daily - 7 x weekly - 1 sets - 10 reps - Standing Lumbar Extension with Counter  - 2 x daily - 7 x weekly - 1 sets - 10 reps  ASSESSMENT:  CLINICAL IMPRESSION: Patient arrives to session in reports of increased low back and RLE pain this date, around 6/10 pain, due to increased work in the past few days. Began this session with review of goals and HEP. Pt tolerated all previous  HEP overall well, reports very mild pain.  Added cat/cow in session to continue lumbar mobility as well as thomas stretch as pt reports increased tightness around belt line and thigh region bilaterally. Mild increase in discomfort but tolerates well. Ended session with moist heat and LE elevated while performing decompression exercises 1-3 for spine. Pt reporting pain relief most with these exercises. Patient will benefit from continued skilled physical therapy in order to address current deficits in order to improve function and quality of life.      EVAL: Patient is a 58 y.o. male who was seen today for physical therapy evaluation and treatment for M54.50 (ICD-10-CM) - Lumbar pain M25.551,M25.552 (ICD-10-CM) - Bilateral hip pain. Patient demonstrates muscle weakness, reduced ROM, and fascial restrictions which are likely contributing to symptoms of pain and are negatively impacting patient ability to perform ADLs and functional mobility tasks. Patient will benefit from skilled physical therapy services to address these deficits to reduce pain and improve level of function with ADLs and functional mobility tasks.   OBJECTIVE IMPAIRMENTS: Abnormal gait,  decreased activity tolerance, increased fascial restrictions, impaired perceived functional ability, impaired flexibility, and pain.   ACTIVITY LIMITATIONS: lifting, bending, sitting, standing, and sleeping  PARTICIPATION LIMITATIONS: meal prep, cleaning, laundry, driving, occupation, and yard work  [facility] POTENTIAL: Good  CLINICAL DECISION MAKING: Evolving/moderate complexity  EVALUATION COMPLEXITY: Moderate   GOALS: Goals reviewed with patient? Yes  SHORT TERM GOALS: Target date: 11/14/2023  patient will be independent with initial HEP  Baseline: Goal status: INITIAL  2.  Patient will report 50% improvement overall  Baseline:  Goal status: INITIAL  LONG TERM GOALS: Target date: 11/28/2023  Patient will be independent in self management strategies to improve quality of life and functional outcomes.   Baseline: Goal status: INITIAL  2.  Patient will report 70% improvement overall  Baseline:  Goal status: INITIAL  3.  Patient will improve Modified Oswestry score by 10 points to demonstrate improved perceived function  Baseline: 34/50 Goal status: INITIAL  4.  Patient will improve 5 times sit to stand score to 15 sec or less to demonstrate improved functional mobility and increased leg strength.    Baseline:  Goal status: INITIAL  5.   Patient will increase right leg MMT's to 4+ to 5/5 to allow navigation of steps without gait deviation or loss of balance  Baseline: see above Goal status: INITIAL  PLAN:  PT FREQUENCY: 2x/week  PT DURATION: 4 weeks  PLANNED INTERVENTIONS: 97164- PT Re-evaluation, 97110-Therapeutic exercises, 97530- Therapeutic activity, 97112- Neuromuscular re-education, 97535- Self Care, 02859- Manual therapy, [id number]- Gait training, [phone]- Orthotic Fit/training, [phone]- Canalith repositioning, [id number]- Aquatic Therapy, 97760- Splinting, [id number]- Wound care (first 20 sq cm), 97598- Wound care (each additional 20 sq cm)Patient/Family education,  Balance training, Stair training, Taping, Dry Needling, Joint mobilization, Joint manipulation, Spinal manipulation, Spinal mobilization, Scar mobilization, and DME instructions. SABRA  PLAN FOR NEXT SESSION: give decompression exercise sheet,  lumbar mobility and postural strengthening; of note right leg length discrepancy (right leg shorter) encourage consistent use of orthotic; decompression exercises.     11:50 AM, 11/03/23 Rosaria Settler, PT, DPT [facility] [place]

## 2023-11-06 ENCOUNTER — Ambulatory Visit (HOSPITAL_COMMUNITY): Admitting: Physical Therapy

## 2023-11-06 DIAGNOSIS — M25552 Pain in left hip: Secondary | ICD-10-CM | POA: Diagnosis not present

## 2023-11-06 DIAGNOSIS — R262 Difficulty in walking, not elsewhere classified: Secondary | ICD-10-CM | POA: Diagnosis not present

## 2023-11-06 DIAGNOSIS — M545 Low back pain, unspecified: Secondary | ICD-10-CM | POA: Diagnosis not present

## 2023-11-06 DIAGNOSIS — M25551 Pain in right hip: Secondary | ICD-10-CM | POA: Diagnosis not present

## 2023-11-06 NOTE — Therapy (Signed)
 OUTPATIENT PHYSICAL THERAPY THORACOLUMBAR TREATMENT   Patient Name: Clifford Hamilton MRN: [id number] DOB:November 09, 1965, 58 y.o., male Today's Date: 11/06/2023  END OF SESSION:  PT End of Session - 11/06/23 0903     Visit Number 3    Number of Visits 8    Date for Recertification  11/28/23    Authorization Type [place] Medicaid [employer]    Authorization Time Period wellcare approved 10 visits from 10/31/23-12/30/2023    Authorization - Visit Number 3    Authorization - Number of Visits 10    Progress Note Due on Visit 10    PT Start Time 0820    PT Stop Time 0903    PT Time Calculation (min) 43 min    Activity Tolerance Patient tolerated treatment well    Behavior During Therapy [facility] for tasks assessed/performed           Past Medical History:  Diagnosis Date   Arthritis    Past Surgical History:  Procedure Laterality Date   APPENDECTOMY     COLONOSCOPY N/A 05/16/2023   Procedure: COLONOSCOPY;  Surgeon: Cindie Carlin POUR, DO;  Location: [facility];  Service: Endoscopy;  Laterality: N/A;  12:45 pm, asa 2   POLYPECTOMY  05/16/2023   Procedure: POLYPECTOMY;  Surgeon: Cindie Carlin POUR, DO;  Location: [facility];  Service: Endoscopy;;   There are no active problems to display for this patient.   PCP: none  REFERRING PROVIDER: Margrette Taft BRAVO, MD REFERRING DIAG: M54.50 (ICD-10-CM) - Lumbar pain M25.551,M25.552 (ICD-10-CM) - Bilateral hip painM54.50 (ICD-10-CM) Rationale for Evaluation and Treatment: Rehabilitation  THERAPY DIAG:  Low back pain, unspecified back pain laterality, unspecified chronicity, unspecified whether sciatica present  Difficulty in walking, not elsewhere classified  Pain in right hip  ONSET DATE: chronic  SUBJECTIVE:                                                                                                                                                                                           SUBJECTIVE STATEMENT: Constant pain of 4/10 with  surges to 6/10.  Pain is in Rt groin area and lower back. Sitting and laying in bed worst, not able to sleep that well. Reports he is around a 6/10 today.  Wearing his orthotics; uses heating pad and doing HEP.   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  Will lay flat for about  a week and then he will get better.  Saw Dr. Margrette when it happened this year.  X-ray's showed some arthritis in his low back; pain is low back and down right leg  PERTINENT HISTORY:  Recently had a bad tooth infection; had to have teeth removed and is still sore  PAIN:  Are you having pain? 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 No   OCCUPATION: outside work, farming  PLOF: Independent  PATIENT GOALS: get stronger  NEXT MD VISIT: PRN  OBJECTIVE:  Note: Objective measures were completed at Evaluation unless otherwise noted.  DIAGNOSTIC FINDINGS:  Back pain   Spine x-ray   3 views   X-rays show a gentle curve to the right lumbar spine normal lordosis.  Small endplate irregularities at L3-4   There may be some evidence of facet arthritis.   No evidence of spondylolysis or listhesis   Impression mild spondylosis lumbar spine  AP pelvis to evaluate bilateral hip pain associated with lower back pain   X-rays of the pelvis show normal x-rays of both hips no signs of avascular necrosis or femoral acetabular impingement   Impression normal hips on the pelvic x-ray   PATIENT SURVEYS:  Modified Oswestry:   MODIFIED OSWESTRY DISABILITY SCALE  Date: 10/31/23 Score  Total 34/50; 68%   Interpretation of scores: Score Category Description  0-20% Minimal Disability The patient can cope with most living activities. Usually no treatment is indicated apart from advice on lifting, sitting and exercise  21-40% Moderate Disability The patient experiences more pain and difficulty with sitting, lifting and standing. Travel and social life are more difficult and they may be disabled from work. Personal care, sexual activity and sleeping are not grossly affected, and the patient can usually be managed by conservative means  41-60% Severe Disability Pain remains the main problem in this group, but activities of daily living are affected. These patients require a detailed investigation  61-80% Crippled Back pain impinges on all aspects of the patient's life. Positive intervention is required  81-100% Bed-bound  These patients are either bed-bound or exaggerating their symptoms  Bluford FORBES Zoe DELENA Karon DELENA, et al. Surgery versus conservative management of stable thoracolumbar fracture: the PRESTO feasibility RCT. [place] ([place]): [employer]; 2021 Nov. [facility] Technology Assessment, No. 25.62.) Appendix 3, Oswestry Disability Index category descriptors. Available from: FindJewel[employer].cz  Minimally Clinically Important Difference (MCID) = 12.8%  COGNITION: Overall cognitive status: Within functional limits for tasks assessed     SENSATION: Reports numbness and tingling down front of right leg   MUSCLE LENGTH: Hamstrings: stretch hamstrings  POSTURE: rounded shoulders and forward head  PALPATION: Tender mid back  LUMBAR ROM: * pain  AROM eval  Flexion Fingertips to mid shin *  Extension 40% available; less painful  Right lateral flexion   Left lateral flexion   Right rotation   Left rotation    (Blank rows = not tested)  LOWER EXTREMITY ROM:     Active   Right eval Left eval  Hip flexion    Hip extension    Hip abduction    Hip adduction    Hip internal rotation    Hip external rotation    Knee  flexion    Knee extension    Ankle dorsiflexion    Ankle plantarflexion    Ankle inversion    Ankle eversion     (Blank rows = not tested)  LOWER EXTREMITY MMT:    MMT Right eval Left eval  Hip flexion 4- 4+  Hip extension 3+ 4+  Hip abduction    Hip adduction    Hip internal rotation    Hip external rotation    Knee flexion 4- 5  Knee extension 4- 5  Ankle dorsiflexion 4- 5  Ankle plantarflexion    Ankle inversion    Ankle eversion     (Blank rows = not tested)  FUNCTIONAL TESTS:  5 times sit to stand: 33.04 sec using hands to assist  GAIT: Distance walked: 60 ft in clinic Assistive device utilized: None Level of assistance: Modified independence Comments: antalgic gait; external rotation of right lower extremity  TREATMENT DATE:  11/06/23 Supine:  decompression 2-5, 5X5 each  Decompression with theraband 1-4, 5X each direction  Bridge 10X  Logroll technique Standing:  lumbar extension 10X5   11/03/23: Review of goals Review of HEP: Standing extension at counter, 5 holds, 10x POE-->cobra/upward dog, 2' Cat/cow, 1', mild discomfort Modified Thomas stretch w/ MHP donned,  2x30 each side Decompression exercises 1-3 w/ MHP donned and wedge under LE, 10x each, 5 holds   10/31/23 physical therapy evaluation and HEP instruction                                                                                                                                 PATIENT EDUCATION:  Education details: Patient educated on exam findings, POC, scope of PT, HEP, and what to expect next visit. Person educated: Patient Education method: Explanation, Demonstration, and Handouts Education comprehension: verbalized understanding, returned demonstration, verbal cues required, and tactile cues required  HOME EXERCISE  PROGRAM: Access Code: [email or web address] URL: [email or web address] Date: 10/31/2023 Prepared by: [facility] - [facility]  Exercises - Supine Transversus Abdominis Bracing - Hands on Stomach  - 2 x daily - 7 x weekly - 1 sets - 10 reps - 5 sec hold - Prone on Elbows Stretch  - 2 x daily - 7 x weekly - 1 sets - 1 reps - 5 min hold - Standing Lumbar Extension  - 2 x daily - 7 x weekly - 1 sets - 10 reps - Standing Lumbar Extension with Counter  - 2 x daily - 7 x weekly - 1 sets - 10 reps  11/06/23 decompression 1-5 5X5 each, Decompression RTB 1-4 5X each  ASSESSMENT:  CLINICAL IMPRESSION: Pt still with pain in Rt groin and Rt lower back but being compliant with HEP and wearing orthotics.  Began decompression exercise with theraband and given these along with home instruction sheet.  Pt instructed with logroll as sits straight up from supine; Pt appears to be more comfortable with knees extended rather than bent with supine so  most exercises were completed with legs out.  Extension seems to help more than flexion. No pain behaviors or complaints during session. Patient will benefit from continued skilled physical therapy in order to address current deficits in order to improve function and quality of life.    EVAL: Patient is a 58 y.o. male who was seen today for physical therapy evaluation and treatment for M54.50 (ICD-10-CM) - Lumbar pain M25.551,M25.552 (ICD-10-CM) - Bilateral hip pain. Patient demonstrates muscle weakness, reduced ROM, and fascial restrictions which are likely contributing to symptoms of pain and are negatively impacting patient ability to perform ADLs and functional mobility tasks. Patient will benefit from skilled physical therapy services to address these deficits to reduce pain and improve level of function with ADLs and functional mobility tasks.   OBJECTIVE IMPAIRMENTS: Abnormal gait, decreased activity tolerance, increased fascial restrictions, impaired perceived functional  ability, impaired flexibility, and pain.   ACTIVITY LIMITATIONS: lifting, bending, sitting, standing, and sleeping  PARTICIPATION LIMITATIONS: meal prep, cleaning, laundry, driving, occupation, and yard work  [facility] POTENTIAL: Good  CLINICAL DECISION MAKING: Evolving/moderate complexity  EVALUATION COMPLEXITY: Moderate   GOALS: Goals reviewed with patient? Yes  SHORT TERM GOALS: Target date: 11/14/2023  patient will be independent with initial HEP  Baseline: Goal status: INITIAL  2.  Patient will report 50% improvement overall  Baseline:  Goal status: INITIAL  LONG TERM GOALS: Target date: 11/28/2023  Patient will be independent in self management strategies to improve quality of life and functional outcomes.   Baseline: Goal status: INITIAL  2.  Patient will report 70% improvement overall  Baseline:  Goal status: INITIAL  3.  Patient will improve Modified Oswestry score by 10 points to demonstrate improved perceived function  Baseline: 34/50 Goal status: INITIAL  4.  Patient will improve 5 times sit to stand score to 15 sec or less to demonstrate improved functional mobility and increased leg strength.    Baseline:  Goal status: INITIAL  5.   Patient will increase right leg MMT's to 4+ to 5/5 to allow navigation of steps without gait deviation or loss of balance  Baseline: see above Goal status: INITIAL  PLAN:  PT FREQUENCY: 2x/week  PT DURATION: 4 weeks  PLANNED INTERVENTIONS: 97164- PT Re-evaluation, 97110-Therapeutic exercises, 97530- Therapeutic activity, 97112- Neuromuscular re-education, 97535- Self Care, [id number]- Manual therapy, [id number]- Gait training, [phone]- Orthotic Fit/training, [id number]- Canalith repositioning, [id number]- Aquatic Therapy, 97760- Splinting, [id number]- Wound care (first 20 sq cm), 97598- Wound care (each additional 20 sq cm)Patient/Family education, Balance training, Stair training, Taping, Dry Needling, Joint mobilization, Joint manipulation,  Spinal manipulation, Spinal mobilization, Scar mobilization, and DME instructions. SABRA  PLAN FOR NEXT SESSION: progress lumbar mobility and postural strengthening.   Encourage consistent use of orthotic (Rt LLD; right leg shorter).  Check SI alignment??  Begin sit to stands and functional LE strengthening next session.   9:04 AM, 11/06/23 Greig KATHEE Fuse, PTA/CLT [facility] Ph: [phone]

## 2023-11-09 ENCOUNTER — Ambulatory Visit (HOSPITAL_COMMUNITY): Admitting: Physical Therapy

## 2023-11-09 DIAGNOSIS — R262 Difficulty in walking, not elsewhere classified: Secondary | ICD-10-CM

## 2023-11-09 DIAGNOSIS — M25551 Pain in right hip: Secondary | ICD-10-CM | POA: Diagnosis not present

## 2023-11-09 DIAGNOSIS — M545 Low back pain, unspecified: Secondary | ICD-10-CM | POA: Diagnosis not present

## 2023-11-09 DIAGNOSIS — M25552 Pain in left hip: Secondary | ICD-10-CM | POA: Diagnosis not present

## 2023-11-09 NOTE — Therapy (Signed)
 OUTPATIENT PHYSICAL THERAPY THORACOLUMBAR TREATMENT   Patient Name: Clifford Hamilton MRN: [id number] DOB:12/21/1965, 58 y.o., male Today's Date: 11/09/2023  END OF SESSION:  PT End of Session - 11/09/23 0816     Visit Number 4    Number of Visits 8    Date for Recertification  11/28/23    Authorization Type [place] Medicaid [employer]    Authorization Time Period well[employer] approved 10 visits from 10/31/23-12/30/2023    Authorization - Number of Visits 10    Progress Note Due on Visit 10    PT Start Time 0733    PT Stop Time 0817    PT Time Calculation (min) 44 min    Activity Tolerance Patient tolerated treatment well    Behavior During Therapy [facility] for tasks assessed/performed            Past Medical History:  Diagnosis Date   Arthritis    Past Surgical History:  Procedure Laterality Date   APPENDECTOMY     COLONOSCOPY N/A 05/16/2023   Procedure: COLONOSCOPY;  Surgeon: Cindie Carlin POUR, DO;  Location: [facility];  Service: Endoscopy;  Laterality: N/A;  12:45 pm, asa 2   POLYPECTOMY  05/16/2023   Procedure: POLYPECTOMY;  Surgeon: Cindie Carlin POUR, DO;  Location: [facility];  Service: Endoscopy;;   There are no active problems to display for this patient.   PCP: none  REFERRING PROVIDER: Margrette Taft BRAVO, MD REFERRING DIAG: M54.50 (ICD-10-CM) - Lumbar pain M25.551,M25.552 (ICD-10-CM) - Bilateral hip painM54.50 (ICD-10-CM) Rationale for Evaluation and Treatment: Rehabilitation  THERAPY DIAG:  Low back pain, unspecified back pain laterality, unspecified chronicity, unspecified whether sciatica present  Difficulty in walking, not elsewhere classified  Pain in right hip  ONSET DATE: chronic  SUBJECTIVE:                                                                                                                                                                                           SUBJECTIVE STATEMENT: Pt reports feeling a little better today but has busy day  ahead. Continues to wear his orthotics; uses heating pad and doing HEP.   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  Will lay flat for about a week and then he will get better.  Saw Dr. Margrette when it happened this year.  X-ray's showed some arthritis in his low back; pain is low back and  down right leg  PERTINENT HISTORY:  Recently had a bad tooth infection; had to have teeth removed and is still sore  PAIN:  Are you having pain? 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 No   OCCUPATION: outside work, farming  PLOF: Independent  PATIENT GOALS: get stronger  NEXT MD VISIT: PRN  OBJECTIVE:  Note: Objective measures were completed at Evaluation unless otherwise noted.  DIAGNOSTIC FINDINGS:  Back pain   Spine x-ray   3 views   X-rays show a gentle curve to the right lumbar spine normal lordosis.  Small endplate irregularities at L3-4   There may be some evidence of facet arthritis.   No evidence of spondylolysis or listhesis   Impression mild spondylosis lumbar spine  AP pelvis to evaluate bilateral hip pain associated with lower back pain   X-rays of the pelvis show normal x-rays of both hips no signs of avascular necrosis or femoral acetabular impingement   Impression normal hips on the pelvic x-ray   PATIENT SURVEYS:  Modified Oswestry:  MODIFIED OSWESTRY DISABILITY SCALE  Date: 10/31/23 Score  Total 34/50; 68%   Interpretation of scores: Score Category Description   0-20% Minimal Disability The patient can cope with most living activities. Usually no treatment is indicated apart from advice on lifting, sitting and exercise  21-40% Moderate Disability The patient experiences more pain and difficulty with sitting, lifting and standing. Travel and social life are more difficult and they may be disabled from work. Personal care, sexual activity and sleeping are not grossly affected, and the patient can usually be managed by conservative means  41-60% Severe Disability Pain remains the main problem in this group, but activities of daily living are affected. These patients require a detailed investigation  61-80% Crippled Back pain impinges on all aspects of the patient's life. Positive intervention is required  81-100% Bed-bound  These patients are either bed-bound or exaggerating their symptoms  Bluford FORBES Zoe DELENA Karon DELENA, et al. Surgery versus conservative management of stable thoracolumbar fracture: the PRESTO feasibility RCT. [place] ([place]): [employer]; 2021 Nov. [facility] Technology Assessment, No. 25.62.) Appendix 3, Oswestry Disability Index category descriptors. Available from: Find[employer]el[employer]cz  Minimally Clinically Important Difference (MCID) = 12.8%  COGNITION: Overall cognitive status: Within functional limits for tasks assessed     SENSATION: Reports numbness and tingling down front of right leg   MUSCLE LENGTH: Hamstrings: stretch hamstrings  POSTURE: rounded shoulders and forward head  PALPATION: Tender mid back  LUMBAR ROM: * pain  AROM eval  Flexion Fingertips to mid shin *  Extension 40% available; less painful  Right lateral flexion   Left lateral flexion   Right rotation   Left rotation    (Blank rows = not tested)  LOWER EXTREMITY ROM:     Active  Right eval Left eval  Hip flexion    Hip extension    Hip abduction    Hip adduction    Hip internal rotation    Hip external  rotation    Knee flexion    Knee extension    Ankle dorsiflexion    Ankle plantarflexion    Ankle inversion    Ankle eversion     (Blank  rows = not tested)  LOWER EXTREMITY MMT:    MMT Right eval Left eval  Hip flexion 4- 4+  Hip extension 3+ 4+  Hip abduction    Hip adduction    Hip internal rotation    Hip external rotation    Knee flexion 4- 5  Knee extension 4- 5  Ankle dorsiflexion 4- 5  Ankle plantarflexion    Ankle inversion    Ankle eversion     (Blank rows = not tested)  11/09/23:  SIJ check (-), LLD measurement (Lt: 39, Rt: 40.5 ASIS to medial malleoli)   FUNCTIONAL TESTS:  5 times sit to stand: 33.04 sec using hands to assist  GAIT: Distance walked: 60 ft in clinic Assistive device utilized: None Level of assistance: Modified independence Comments: antalgic gait; external rotation of right lower extremity  TREATMENT DATE:  11/09/23 Nustep UE/LE level 4 seat 10 [place] 5 minutes Standing:  GTB rows 2X10  GTB shoulder extension 2X10  GTB pallof 10X each direction SIJ check (-), LLD measurement (Lt: 39, Rt: 40.5 ASIS to medial malleoli) Supine:  bridge 210  SLR 10X each   11/06/23 Supine:  decompression 2-5, 5X5 each  Decompression with theraband 1-4, 5X each direction  Bridge 10X  Logroll technique Standing:  lumbar extension 10X5   11/03/23: Review of goals Review of HEP: Standing extension at counter, 5 holds, 10x POE-->cobra/upward dog, 2' Cat/cow, 1', mild discomfort Modified Thomas stretch w/ MHP donned,  2x30 each side Decompression exercises 1-3 w/ MHP donned and wedge under LE, 10x each, 5 holds   10/31/23 physical therapy evaluation and HEP instruction                                                                                                                                 PATIENT EDUCATION:  Education details: Patient educated on exam findings, POC, scope of PT, HEP, and what to expect next visit. Person  educated: Patient Education method: Explanation, Demonstration, and Handouts Education comprehension: verbalized understanding, returned demonstration, verbal cues required, and tactile cues required  HOME EXERCISE PROGRAM: Access Code: [email or web address] URL: [email or web address] Date: 10/31/2023 Prepared by: [facility] - [facility]  Exercises - Supine Transversus Abdominis Bracing - Hands on Stomach  - 2 x daily - 7 x weekly - 1 sets - 10 reps - 5 sec hold - Prone on Elbows Stretch  - 2 x daily - 7 x weekly - 1 sets - 1 reps - 5 min hold - Standing Lumbar Extension  - 2 x daily - 7 x weekly - 1 sets - 10 reps - Standing Lumbar Extension with Counter  - 2 x daily - 7 x weekly - 1 sets - 10 reps  11/06/23 decompression 1-5 5X5 each, Decompression RTB 1-4 5X each  ASSESSMENT:  CLINICAL IMPRESSION: Began session with nustep for dynamic warm up.  Postural and core strengthening added today using therabands with minimal cues for form and  overall good control with activity.  Pt with reported weakness in core when doing pallof exercise.  Sacroiliac joint with only minimal anterior rotation, no muscle energy techniques completed this session.  LLD was measured with true discrepancy of Lt being 1.5 inch shorter than Rt.  Continued with supine exercises with pt completing all exercises and mobility today without pain behaviors or complaints. Pt remained jovial throughout session.  Patient will benefit from continued skilled physical therapy in order to address current deficits in order to improve function and quality of life.    EVAL: Patient is a 58 y.o. male who was seen today for physical therapy evaluation and treatment for M54.50 (ICD-10-CM) - Lumbar pain M25.551,M25.552 (ICD-10-CM) - Bilateral hip pain. Patient demonstrates muscle weakness, reduced ROM, and fascial restrictions which are likely contributing to symptoms of pain and are negatively impacting patient ability to perform ADLs and functional  mobility tasks. Patient will benefit from skilled physical therapy services to address these deficits to reduce pain and improve level of function with ADLs and functional mobility tasks.   OBJECTIVE IMPAIRMENTS: Abnormal gait, decreased activity tolerance, increased fascial restrictions, impaired perceived functional ability, impaired flexibility, and pain.   ACTIVITY LIMITATIONS: lifting, bending, sitting, standing, and sleeping  PARTICIPATION LIMITATIONS: meal prep, cleaning, laundry, driving, occupation, and yard work  [facility] POTENTIAL: Good  CLINICAL DECISION MAKING: Evolving/moderate complexity  EVALUATION COMPLEXITY: Moderate   GOALS: Goals reviewed with patient? Yes  SHORT TERM GOALS: Target date: 11/14/2023  patient will be independent with initial HEP  Baseline: Goal status: INITIAL  2.  Patient will report 50% improvement overall  Baseline:  Goal status: INITIAL  LONG TERM GOALS: Target date: 11/28/2023  Patient will be independent in self management strategies to improve quality of life and functional outcomes.   Baseline: Goal status: INITIAL  2.  Patient will report 70% improvement overall  Baseline:  Goal status: INITIAL  3.  Patient will improve Modified Oswestry score by 10 points to demonstrate improved perceived function  Baseline: 34/50 Goal status: INITIAL  4.  Patient will improve 5 times sit to stand score to 15 sec or less to demonstrate improved functional mobility and increased leg strength.    Baseline:  Goal status: INITIAL  5.   Patient will increase right leg MMT's to 4+ to 5/5 to allow navigation of steps without gait deviation or loss of balance  Baseline: see above Goal status: INITIAL  PLAN:  PT FREQUENCY: 2x/week  PT DURATION: 4 weeks  PLANNED INTERVENTIONS: 97164- PT Re-evaluation, 97110-Therapeutic exercises, 97530- Therapeutic activity, 97112- Neuromuscular re-education, 97535- Self Care, [id number]- Manual therapy,  [id number]- Gait training, [phone]- Orthotic Fit/training, [phone]- Canalith repositioning, [id number]- Aquatic Therapy, 97760- Splinting, [id number]- Wound care (first 20 sq cm), 97598- Wound care (each additional 20 sq cm)Patient/Family education, Balance training, Stair training, Taping, Dry Needling, Joint mobilization, Joint manipulation, Spinal manipulation, Spinal mobilization, Scar mobilization, and DME instructions. SABRA  PLAN FOR NEXT SESSION: progress lumbar mobility and postural strengthening. Begin sit to stands and progress functional LE strengthening.   9:12 AM, 11/09/23 Greig KATHEE Fuse, PTA/CLT [facility] Ph: [phone]

## 2023-11-15 ENCOUNTER — Ambulatory Visit (HOSPITAL_COMMUNITY): Attending: Orthopedic Surgery | Admitting: Physical Therapy

## 2023-11-15 DIAGNOSIS — M545 Low back pain, unspecified: Secondary | ICD-10-CM | POA: Insufficient documentation

## 2023-11-15 DIAGNOSIS — M25551 Pain in right hip: Secondary | ICD-10-CM | POA: Diagnosis not present

## 2023-11-15 DIAGNOSIS — R262 Difficulty in walking, not elsewhere classified: Secondary | ICD-10-CM | POA: Diagnosis not present

## 2023-11-15 DIAGNOSIS — H5213 Myopia, bilateral: Secondary | ICD-10-CM | POA: Diagnosis not present

## 2023-11-15 NOTE — Therapy (Signed)
 OUTPATIENT PHYSICAL THERAPY THORACOLUMBAR TREATMENT   Patient Name: Clifford Hamilton MRN: [id number] DOB:1965/11/04, 58 y.o., male Today's Date: 11/15/2023  END OF SESSION:  PT End of Session - 11/15/23 0738     Visit Number 5    Number of Visits 8    Date for Recertification  11/28/23    Authorization Type [place] Medicaid [employer]    Authorization Time Period well[employer] approved 10 visits from 10/31/23-12/30/2023    Authorization - Visit Number 5    Authorization - Number of Visits 10    Progress Note Due on Visit 10    PT Start Time 0734    PT Stop Time 0816    PT Time Calculation (min) 42 min    Activity Tolerance Patient tolerated treatment well    Behavior During Therapy [facility] for tasks assessed/performed            Past Medical History:  Diagnosis Date   Arthritis    Past Surgical History:  Procedure Laterality Date   APPENDECTOMY     COLONOSCOPY N/A 05/16/2023   Procedure: COLONOSCOPY;  Surgeon: Cindie Carlin POUR, DO;  Location: [facility];  Service: Endoscopy;  Laterality: N/A;  12:45 pm, asa 2   POLYPECTOMY  05/16/2023   Procedure: POLYPECTOMY;  Surgeon: Cindie Carlin POUR, DO;  Location: [facility];  Service: Endoscopy;;   There are no active problems to display for this patient.   PCP: none  REFERRING PROVIDER: Margrette Taft BRAVO, MD REFERRING DIAG: M54.50 (ICD-10-CM) - Lumbar pain M25.551,M25.552 (ICD-10-CM) - Bilateral hip painM54.50 (ICD-10-CM) Rationale for Evaluation and Treatment: Rehabilitation  THERAPY DIAG:  Low back pain, unspecified back pain laterality, unspecified chronicity, unspecified whether sciatica present  ONSET DATE: chronic  SUBJECTIVE:                                                                                                                                                                                           SUBJECTIVE STATEMENT: Pt reports doing about the same today .   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  Will lay flat for about a week and then he will get better.  Saw Dr. Margrette when it happened this year.  X-ray's showed some arthritis in his low back; pain is low back and down right leg  PERTINENT HISTORY:  Recently had a bad tooth infection; had to have teeth removed and  is still sore  PAIN:  Are you having pain? 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 No   OCCUPATION: outside work, farming  PLOF: Independent  PATIENT GOALS: get stronger  NEXT MD VISIT: PRN  OBJECTIVE:  Note: Objective measures were completed at Evaluation unless otherwise noted.  DIAGNOSTIC FINDINGS:  Back pain   Spine x-ray   3 views   X-rays show a gentle curve to the right lumbar spine normal lordosis.  Small endplate irregularities at L3-4   There may be some evidence of facet arthritis.   No evidence of spondylolysis or listhesis   Impression mild spondylosis lumbar spine  AP pelvis to evaluate bilateral hip pain associated with lower back pain   X-rays of the pelvis show normal x-rays of both hips no signs of avascular necrosis or femoral acetabular impingement   Impression normal hips on the pelvic x-ray   PATIENT SURVEYS:  Modified Oswestry:  MODIFIED OSWESTRY DISABILITY SCALE  Date: 10/31/23 Score  Total 34/50; 68%   Interpretation of scores: Score Category Description  0-20% Minimal Disability The patient can cope with most living activities. Usually no treatment is indicated apart from  advice on lifting, sitting and exercise  21-40% Moderate Disability The patient experiences more pain and difficulty with sitting, lifting and standing. Travel and social life are more difficult and they may be disabled from work. Personal care, sexual activity and sleeping are not grossly affected, and the patient can usually be managed by conservative means  41-60% Severe Disability Pain remains the main problem in this group, but activities of daily living are affected. These patients require a detailed investigation  61-80% Crippled Back pain impinges on all aspects of the patient's life. Positive intervention is required  81-100% Bed-bound  These patients are either bed-bound or exaggerating their symptoms  Bluford FORBES Zoe DELENA Karon DELENA, et al. Surgery versus conservative management of stable thoracolumbar fracture: the PRESTO feasibility RCT. [place] ([place]): [employer]; 2021 Nov. [facility] Technology Assessment, No. 25.62.) Appendix 3, Oswestry Disability Index category descriptors. Available from: FindJewel[employer]cz  Minimally Clinically Important Difference (MCID) = 12.8%  COGNITION: Overall cognitive status: Within functional limits for tasks assessed     SENSATION: Reports numbness and tingling down front of right leg   MUSCLE LENGTH: Hamstrings: stretch hamstrings  POSTURE: rounded shoulders and forward head  PALPATION: Tender mid back  LUMBAR ROM: * pain  AROM eval  Flexion Fingertips to mid shin *  Extension 40% available; less painful  Right lateral flexion   Left lateral flexion   Right rotation   Left rotation    (Blank rows = not tested)  LOWER EXTREMITY ROM:     Active  Right eval Left eval  Hip flexion    Hip extension    Hip abduction    Hip adduction    Hip internal rotation    Hip external rotation    Knee flexion    Knee extension    Ankle dorsiflexion    Ankle plantarflexion    Ankle inversion    Ankle  eversion     (Blank rows = not tested)  LOWER EXTREMITY MMT:    MMT Right eval Left eval  Hip flexion  4- 4+  Hip extension 3+ 4+  Hip abduction    Hip adduction    Hip internal rotation    Hip external rotation    Knee flexion 4- 5  Knee extension 4- 5  Ankle dorsiflexion 4- 5  Ankle plantarflexion    Ankle inversion    Ankle eversion     (Blank rows = not tested)  11/09/23:  SIJ check (-), LLD measurement (Lt: 39, Rt: 40.5 ASIS to medial malleoli)   FUNCTIONAL TESTS:  5 times sit to stand: 33.04 sec using hands to assist  GAIT: Distance walked: 60 ft in clinic Assistive device utilized: None Level of assistance: Modified independence Comments: antalgic gait; external rotation of right lower extremity  TREATMENT DATE:  11/15/23 Nustep UE/LE level 4, seat 10; 6 minutes Standing:  lumbar extension on // bar 10X  Forward lunges onto 4 step no UE 2X10  Hip abduction 2X10 each LE Sit to stands 10X no UE assist  11/09/23 Nustep UE/LE level 4 seat 10 [place] 5 minutes Standing:  GTB rows 2X10  GTB shoulder extension 2X10  GTB pallof 10X each direction SIJ check (-), LLD measurement (Lt: 39, Rt: 40.5 ASIS to medial malleoli) Supine:  bridge 210  SLR 10X each   11/06/23 Supine:  decompression 2-5, 5X5 each  Decompression with theraband 1-4, 5X each direction  Bridge 10X  Logroll technique Standing:  lumbar extension 10X5   11/03/23: Review of goals Review of HEP: Standing extension at counter, 5 holds, 10x POE-->cobra/upward dog, 2' Cat/cow, 1', mild discomfort Modified Thomas stretch w/ MHP donned,  2x30 each side Decompression exercises 1-3 w/ MHP donned and wedge under LE, 10x each, 5 holds   10/31/23 physical therapy evaluation and HEP instruction                                                                                                                                 PATIENT EDUCATION:  Education details: Patient educated on exam  findings, POC, scope of PT, HEP, and what to expect next visit. Person educated: Patient Education method: Explanation, Demonstration, and Handouts Education comprehension: verbalized understanding, returned demonstration, verbal cues required, and tactile cues required  HOME EXERCISE PROGRAM: Access Code: [email or web address] URL: [email or web address] Date: 10/31/2023 Prepared by: [facility] - [facility]hab  Exercises - Supine Transversus Abdominis Bracing - Hands on Stomach  - 2 x daily - 7 x weekly - 1 sets - 10 reps - 5 sec hold - Prone on Elbows Stretch  - 2 x daily - 7 x weekly - 1 sets - 1 reps - 5 min hold - Standing Lumbar Extension  - 2 x daily - 7 x weekly - 1 sets - 10 reps - Standing Lumbar Extension with Counter  - 2 x daily - 7 x weekly - 1 sets - 10 reps  11/06/23 decompression 1-5 5X5 each, Decompression RTB 1-4 5X each  ASSESSMENT:  CLINICAL IMPRESSION: Continued with  Postural and core strengthening following warm up on nustep. Progressed with sit to stands and standing functional strength today.  Pt had to place hands on knees to complete and cues to maintain upright posturing.  Pt also with difficulty stabilizing upright with hip abduction activity requiring VC to correct.  Therapist maintained count of therex as pt was chatty during session and was not keeping up with repetitions. Pt completed entire session without pain behaviors or complaints.  Patient will continue to benefit from continued skilled physical therapy in order to address current deficits in order to improve function and quality of life.   EVAL: Patient is a 58 y.o. male who was seen today for physical therapy evaluation and treatment for M54.50 (ICD-10-CM) - Lumbar pain M25.551,M25.552 (ICD-10-CM) - Bilateral hip pain. Patient demonstrates muscle weakness, reduced ROM, and fascial restrictions which are likely contributing to symptoms of pain and are negatively impacting patient ability to perform ADLs and functional  mobility tasks. Patient will benefit from skilled physical therapy services to address these deficits to reduce pain and improve level of function with ADLs and functional mobility tasks.   OBJECTIVE IMPAIRMENTS: Abnormal gait, decreased activity tolerance, increased fascial restrictions, impaired perceived functional ability, impaired flexibility, and pain.   ACTIVITY LIMITATIONS: lifting, bending, sitting, standing, and sleeping  PARTICIPATION LIMITATIONS: meal prep, cleaning, laundry, driving, occupation, and yard work  [facility] POTENTIAL: Good  CLINICAL DECISION MAKING: Evolving/moderate complexity  EVALUATION COMPLEXITY: Moderate   GOALS: Goals reviewed with patient? Yes  SHORT TERM GOALS: Target date: 11/14/2023  patient will be independent with initial HEP  Baseline: Goal status: INITIAL  2.  Patient will report 50% improvement overall  Baseline:  Goal status: INITIAL  LONG TERM GOALS: Target date: 11/28/2023  Patient will be independent in self management strategies to improve quality of life and functional outcomes.   Baseline: Goal status: INITIAL  2.  Patient will report 70% improvement overall  Baseline:  Goal status: INITIAL  3.  Patient will improve Modified Oswestry score by 10 points to demonstrate improved perceived function  Baseline: 34/50 Goal status: INITIAL  4.  Patient will improve 5 times sit to stand score to 15 sec or less to demonstrate improved functional mobility and increased leg strength.    Baseline:  Goal status: INITIAL  5.   Patient will increase right leg MMT's to 4+ to 5/5 to allow navigation of steps without gait deviation or loss of balance  Baseline: see above Goal status: INITIAL  PLAN:  PT FREQUENCY: 2x/week  PT DURATION: 4 weeks  PLANNED INTERVENTIONS: 97164- PT Re-evaluation, 97110-Therapeutic exercises, 97530- Therapeutic activity, 97112- Neuromuscular re-education, 97535- Self Care, [id number]- Manual therapy,  [id number]- Gait training, [phone]- Orthotic Fit/training, [phone]- Canalith repositioning, [id number]- Aquatic Therapy, 97760- Splinting, [id number]- Wound care (first 20 sq cm), 97598- Wound care (each additional 20 sq cm)Patient/Family education, Balance training, Stair training, Taping, Dry Needling, Joint mobilization, Joint manipulation, Spinal manipulation, Spinal mobilization, Scar mobilization, and DME instructions. SABRA  PLAN FOR NEXT SESSION: progress lumbar mobility, functional LE and postural strengthening.   9:58 AM, 11/15/23 Greig KATHEE Fuse, PTA/CLT [facility] Ph: [phone]

## 2023-11-20 ENCOUNTER — Ambulatory Visit (HOSPITAL_COMMUNITY)

## 2023-11-20 DIAGNOSIS — R262 Difficulty in walking, not elsewhere classified: Secondary | ICD-10-CM | POA: Diagnosis not present

## 2023-11-20 DIAGNOSIS — M25551 Pain in right hip: Secondary | ICD-10-CM | POA: Diagnosis not present

## 2023-11-20 DIAGNOSIS — M545 Low back pain, unspecified: Secondary | ICD-10-CM

## 2023-11-20 NOTE — Therapy (Signed)
 OUTPATIENT PHYSICAL THERAPY THORACOLUMBAR TREATMENT   Patient Name: Clifford Hamilton MRN: [id number] DOB:09-Oct-1965, 58 y.o., male Today's Date: 11/20/2023  END OF SESSION:  PT End of Session - 11/20/23 0729     Visit Number 6    Number of Visits 8    Date for Recertification  11/28/23    Authorization Type [place] [employer]    Authorization Time Period wellcare approved 10 visits from 10/31/23-12/30/2023    Authorization - Visit Number 6    Authorization - Number of Visits 10    Progress Note Due on Visit 10    PT Start Time 0729    PT Stop Time 0800    PT Time Calculation (min) 31 min    Activity Tolerance Patient tolerated treatment well    Behavior During Therapy [facility] for tasks assessed/performed            Past Medical History:  Diagnosis Date   Arthritis    Past Surgical History:  Procedure Laterality Date   APPENDECTOMY     COLONOSCOPY N/A 05/16/2023   Procedure: COLONOSCOPY;  Surgeon: Cindie Carlin POUR, DO;  Location: [facility];  Service: Endoscopy;  Laterality: N/A;  12:45 pm, asa 2   POLYPECTOMY  05/16/2023   Procedure: POLYPECTOMY;  Surgeon: Cindie Carlin POUR, DO;  Location: [facility];  Service: Endoscopy;;   There are no active problems to display for this patient.   PCP: none  REFERRING PROVIDER: Margrette Taft BRAVO, MD REFERRING DIAG: M54.50 (ICD-10-CM) - Lumbar pain M25.551,M25.552 (ICD-10-CM) - Bilateral hip painM54.50 (ICD-10-CM) Rationale for Evaluation and Treatment: Rehabilitation  THERAPY DIAG:  Low back pain, unspecified back pain laterality, unspecified chronicity, unspecified whether sciatica present  Difficulty in walking, not elsewhere classified  Pain in right hip  ONSET DATE: chronic  SUBJECTIVE:                                                                                                                                                                                           SUBJECTIVE STATEMENT: Stiff this morning;  5/10 back pain with sitting or laying or prolonged standing. Reports he is doing more stretching and that seems to help.  States he has to leave early to go take his daughter to school this morning.    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  Will lay flat for about a week and then he  will get better.  Saw Dr. Margrette when it happened this year.  X-ray's showed some arthritis in his low back; pain is low back and down right leg  PERTINENT HISTORY:  Recently had a bad tooth infection; had to have teeth removed and is still sore  PAIN:  Are you having pain? 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 No   OCCUPATION: outside work, farming  PLOF: Independent  PATIENT GOALS: get stronger  NEXT MD VISIT: PRN  OBJECTIVE:  Note: Objective measures were completed at Evaluation unless otherwise noted.  DIAGNOSTIC FINDINGS:  Back pain   Spine x-ray   3 views   X-rays show a gentle curve to the right lumbar spine normal lordosis.  Small endplate irregularities at L3-4   There may be some evidence of facet arthritis.   No evidence of spondylolysis or listhesis   Impression mild spondylosis lumbar spine  AP pelvis to evaluate bilateral hip pain associated with lower back pain   X-rays of the pelvis show normal x-rays of both hips no signs of avascular necrosis or femoral acetabular impingement   Impression normal hips on the pelvic x-ray   PATIENT SURVEYS:  Modified Oswestry:  MODIFIED  OSWESTRY DISABILITY SCALE  Date: 10/31/23 Score  Total 34/50; 68%   Interpretation of scores: Score Category Description  0-20% Minimal Disability The patient can cope with most living activities. Usually no treatment is indicated apart from advice on lifting, sitting and exercise  21-40% Moderate Disability The patient experiences more pain and difficulty with sitting, lifting and standing. Travel and social life are more difficult and they may be disabled from work. Personal care, sexual activity and sleeping are not grossly affected, and the patient can usually be managed by conservative means  41-60% Severe Disability Pain remains the main problem in this group, but activities of daily living are affected. These patients require a detailed investigation  61-80% Crippled Back pain impinges on all aspects of the patient's life. Positive intervention is required  81-100% Bed-bound  These patients are either bed-bound or exaggerating their symptoms  Bluford FORBES Zoe DELENA Karon DELENA, et al. Surgery versus conservative management of stable thoracolumbar fracture: the PRESTO feasibility RCT. [place] ([place]): [employer]; 2021 Nov. [facility] Technology Assessment, No. 25.62.) Appendix 3, Oswestry Disability Index category descriptors. Available from: Find[employer]el[employer].cz  Minimally Clinically Important Difference (MCID) = 12.8%  COGNITION: Overall cognitive status: Within functional limits for tasks assessed     SENSATION: Reports numbness and tingling down front of right leg   MUSCLE LENGTH: Hamstrings: stretch hamstrings  POSTURE: rounded shoulders and forward head  PALPATION: Tender mid back  LUMBAR ROM: * pain  AROM eval  Flexion Fingertips to mid shin *  Extension 40% available; less painful  Right lateral flexion   Left lateral flexion   Right rotation   Left rotation    (Blank rows = not tested)  LOWER EXTREMITY ROM:     Active  Right eval  Left eval  Hip flexion    Hip extension    Hip abduction    Hip adduction    Hip internal rotation    Hip external rotation    Knee flexion    Knee  extension    Ankle dorsiflexion    Ankle plantarflexion    Ankle inversion    Ankle eversion     (Blank rows = not tested)  LOWER EXTREMITY MMT:    MMT Right eval Left eval  Hip flexion 4- 4+  Hip extension 3+ 4+  Hip abduction    Hip adduction    Hip internal rotation    Hip external rotation    Knee flexion 4- 5  Knee extension 4- 5  Ankle dorsiflexion 4- 5  Ankle plantarflexion    Ankle inversion    Ankle eversion     (Blank rows = not tested)  11/09/23:  SIJ check (-), LLD measurement (Lt: 39, Rt: 40.5 ASIS to medial malleoli)   FUNCTIONAL TESTS:  5 times sit to stand: 33.04 sec using hands to assist  GAIT: Distance walked: 60 ft in clinic Assistive device utilized: None Level of assistance: Modified independence Comments: antalgic gait; external rotation of right lower extremity  TREATMENT DATE:  11/20/23 Nustep seat 10 level 3 x 5' dynamic warm up Heel raises on incline 2 x 10 Toe raises on decline 2 x 10 Slant board 5 x 20 Lumbar extension over the bar x 10 Body craft Leg press 7 plates 3 x 10 Hamstring curl cybex 6.5 plates 2 x 10   11/15/23 Nustep UE/LE level 4, seat 10; 6 minutes Standing:  lumbar extension on // bar 10X  Forward lunges onto 4 step no UE 2X10  Hip abduction 2X10 each LE Sit to stands 10X no UE assist  11/09/23 Nustep UE/LE level 4 seat 10 [place] 5 minutes Standing:  GTB rows 2X10  GTB shoulder extension 2X10  GTB pallof 10X each direction SIJ check (-), LLD measurement (Lt: 39, Rt: 40.5 ASIS to medial malleoli) Supine:  bridge 210  SLR 10X each   11/06/23 Supine:  decompression 2-5, 5X5 each  Decompression with theraband 1-4, 5X each direction  Bridge 10X  Logroll technique Standing:  lumbar extension 10X5   11/03/23: Review of goals Review of  HEP: Standing extension at counter, 5 holds, 10x POE-->cobra/upward dog, 2' Cat/cow, 1', mild discomfort Modified Thomas stretch w/ MHP donned,  2x30 each side Decompression exercises 1-3 w/ MHP donned and wedge under LE, 10x each, 5 holds   10/31/23 physical therapy evaluation and HEP instruction                                                                                                                                 PATIENT EDUCATION:  Education details: Patient educated on exam findings, POC, scope of PT, HEP, and what to expect next visit. Person educated: Patient Education method: Explanation, Demonstration, and Handouts Education comprehension: verbalized understanding, returned demonstration, verbal cues required, and tactile cues required  HOME EXERCISE PROGRAM: Access Code: [email or web address] URL: [email or web address] Date: 10/31/2023 Prepared by: [facility] - Rehab  Exercises - Supine Transversus Abdominis Bracing - Hands on Stomach  -  2 x daily - 7 x weekly - 1 sets - 10 reps - 5 sec hold - Prone on Elbows Stretch  - 2 x daily - 7 x weekly - 1 sets - 1 reps - 5 min hold - Standing Lumbar Extension  - 2 x daily - 7 x weekly - 1 sets - 10 reps - Standing Lumbar Extension with Counter  - 2 x daily - 7 x weekly - 1 sets - 10 reps  11/06/23 decompression 1-5 5X5 each, Decompression RTB 1-4 5X each  ASSESSMENT:  CLINICAL IMPRESSION: Today's session with continued focus on lumbar mobility; core strength and posturing. Challenge with toe raises on decline; has to use hands intermittently for balance. Added leg press and hamstring curl to treatment today; needs cues for control.  Patient has to leave session early to take his daughter to school this morning.  Patient will continue to benefit from continued skilled physical therapy in order to address current deficits in order to improve function and quality of life.   EVAL: Patient is a 58 y.o. male who was seen today for  physical therapy evaluation and treatment for M54.50 (ICD-10-CM) - Lumbar pain M25.551,M25.552 (ICD-10-CM) - Bilateral hip pain. Patient demonstrates muscle weakness, reduced ROM, and fascial restrictions which are likely contributing to symptoms of pain and are negatively impacting patient ability to perform ADLs and functional mobility tasks. Patient will benefit from skilled physical therapy services to address these deficits to reduce pain and improve level of function with ADLs and functional mobility tasks.   OBJECTIVE IMPAIRMENTS: Abnormal gait, decreased activity tolerance, increased fascial restrictions, impaired perceived functional ability, impaired flexibility, and pain.   ACTIVITY LIMITATIONS: lifting, bending, sitting, standing, and sleeping  PARTICIPATION LIMITATIONS: meal prep, cleaning, laundry, driving, occupation, and yard work  [facility] POTENTIAL: Good  CLINICAL DECISION MAKING: Evolving/moderate complexity  EVALUATION COMPLEXITY: Moderate   GOALS: Goals reviewed with patient? Yes  SHORT TERM GOALS: Target date: 11/14/2023  patient will be independent with initial HEP  Baseline: Goal status: in progress  2.  Patient will report 50% improvement overall  Baseline:  Goal status: in progress  LONG TERM GOALS: Target date: 11/28/2023  Patient will be independent in self management strategies to improve quality of life and functional outcomes.   Baseline: Goal status: in progress   2.  Patient will report 70% improvement overall  Baseline:  Goal status: in progress  3.  Patient will improve Modified Oswestry score by 10 points to demonstrate improved perceived function  Baseline: 34/50 Goal status: in progress  4.  Patient will improve 5 times sit to stand score to 15 sec or less to demonstrate improved functional mobility and increased leg strength.    Baseline:  Goal status: in progress  5.   Patient will increase right leg MMT's to 4+ to 5/5 to  allow navigation of steps without gait deviation or loss of balance  Baseline: see above Goal status: in progress  PLAN:  PT FREQUENCY: 2x/week  PT DURATION: 4 weeks  PLANNED INTERVENTIONS: 97164- PT Re-evaluation, 97110-Therapeutic exercises, 97530- Therapeutic activity, 97112- Neuromuscular re-education, 97535- Self Care, [id number]- Manual therapy, [id number]- Gait training, [phone]- Orthotic Fit/training, [phone]- Canalith repositioning, [id number]- Aquatic Therapy, 97760- Splinting, [id number]- Wound care (first 20 sq cm), 97598- Wound care (each additional 20 sq cm)Patient/Family education, Balance training, Stair training, Taping, Dry Needling, Joint mobilization, Joint manipulation, Spinal manipulation, Spinal mobilization, Scar mobilization, and DME instructions. SABRA  PLAN FOR NEXT SESSION: progress lumbar mobility, functional LE and  postural strengthening.   7:58 AM, 11/20/23 Kahleel Fadeley Small Camrin Gearheart MPT [facility] [place] [phone]

## 2023-11-23 ENCOUNTER — Encounter (HOSPITAL_COMMUNITY)

## 2023-11-26 DIAGNOSIS — Z419 Encounter for procedure for purposes other than remedying health state, unspecified: Secondary | ICD-10-CM | POA: Diagnosis not present

## 2023-11-27 ENCOUNTER — Ambulatory Visit (HOSPITAL_COMMUNITY)

## 2023-11-27 DIAGNOSIS — M25551 Pain in right hip: Secondary | ICD-10-CM

## 2023-11-27 DIAGNOSIS — M545 Low back pain, unspecified: Secondary | ICD-10-CM

## 2023-11-27 DIAGNOSIS — R262 Difficulty in walking, not elsewhere classified: Secondary | ICD-10-CM | POA: Diagnosis not present

## 2023-11-27 NOTE — Therapy (Signed)
 OUTPATIENT PHYSICAL THERAPY THORACOLUMBAR TREATMENT/PROGRESS NOTE/DISCHARGE Progress Note Reporting Period 10/31/2023 to 11/27/2023  See note below for Objective Data and Assessment of Progress/Goals.  PHYSICAL THERAPY DISCHARGE SUMMARY  Visits from Start of Care: 7  Current functional level related to goals / functional outcomes: See below   Remaining deficits: See below   Education / Equipment: HEP   Patient agrees to discharge. Patient goals were partially met. Patient is being discharged due to progress plateau.       Patient Name: Clifford Hamilton MRN: [id number] DOB:[birth year], 58 y.o., male Today's Date: 11/27/2023  END OF SESSION:  PT End of Session - 11/27/23 0816     Visit Number 7    Number of Visits 8    Date for Recertification  11/28/23    Authorization Type [place] Medicaid [employer]    Authorization Time Period wellcare approved 10 visits from 10/31/23-12/30/2023    Authorization - Visit Number 7    Authorization - Number of Visits 10    Progress Note Due on Visit 10    PT Start Time 0816    PT Stop Time 0856    PT Time Calculation (min) 40 min    Activity Tolerance Patient tolerated treatment well    Behavior During Therapy [facility] for tasks assessed/performed            Past Medical History:  Diagnosis Date   Arthritis    Past Surgical History:  Procedure Laterality Date   APPENDECTOMY     COLONOSCOPY N/A 05/16/2023   Procedure: COLONOSCOPY;  Surgeon: Cindie Carlin POUR, DO;  Location: [facility];  Service: Endoscopy;  Laterality: N/A;  12:45 pm, asa 2   POLYPECTOMY  05/16/2023   Procedure: POLYPECTOMY;  Surgeon: Cindie Carlin POUR, DO;  Location: [facility];  Service: Endoscopy;;   There are no active problems to display for this patient.   PCP: none  REFERRING PROVIDER: Margrette Taft BRAVO, MD REFERRING DIAG: M54.50 (ICD-10-CM) - Lumbar pain M25.551,M25.552 (ICD-10-CM) - Bilateral hip painM54.50 (ICD-10-CM) Rationale for Evaluation and  Treatment: Rehabilitation  THERAPY DIAG:  Low back pain, unspecified back pain laterality, unspecified chronicity, unspecified whether sciatica present  Difficulty in walking, not elsewhere classified  Pain in right hip  ONSET DATE: chronic  SUBJECTIVE:                                                                                                                                                                                           SUBJECTIVE STATEMENT: Generally doesn't feel well the past few days; has had a cold; his pain has not changed but he is moving better and feels  stronger.  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  Will lay flat for about a week and then he will get better.  Saw Dr. Margrette when it happened this year.  X-ray's showed some arthritis in his low back; pain is low back and down right leg  PERTINENT HISTORY:  Recently had a bad tooth infection; had to have teeth removed and is still sore  PAIN:  Are you having pain? 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 No   OCCUPATION: outside work, farming  PLOF: Independent  PATIENT GOALS: get stronger  NEXT MD VISIT: PRN  OBJECTIVE:  Note: Objective measures were completed at Evaluation unless otherwise noted.  DIAGNOSTIC FINDINGS:  Back pain   Spine x-ray   3 views   X-rays show a gentle curve to the right lumbar spine normal lordosis.  Small endplate irregularities at L3-4   There may be some evidence of  facet arthritis.   No evidence of spondylolysis or listhesis   Impression mild spondylosis lumbar spine  AP pelvis to evaluate bilateral hip pain associated with lower back pain   X-rays of the pelvis show normal x-rays of both hips no signs of avascular necrosis or femoral acetabular impingement   Impression normal hips on the pelvic x-ray   PATIENT SURVEYS:  Modified Oswestry:  MODIFIED OSWESTRY DISABILITY SCALE  Date: 10/31/23 Score  Total 34/50; 68%   Interpretation of scores: Score Category Description  0-20% Minimal Disability The patient can cope with most living activities. Usually no treatment is indicated apart from advice on lifting, sitting and exercise  21-40% Moderate Disability The patient experiences more pain and difficulty with sitting, lifting and standing. Travel and social life are more difficult and they may be disabled from work. Personal care, sexual activity and sleeping are not grossly affected, and the patient can usually be managed by conservative means  41-60% Severe Disability Pain remains the main problem in this group, but activities of daily living are affected. These patients require a detailed investigation  61-80% Crippled Back pain impinges on all aspects of the patient's life. Positive intervention is required  81-100% Bed-bound  These patients are either bed-bound or exaggerating their symptoms  Bluford FORBES Zoe DELENA Karon DELENA, et al. Surgery versus conservative management of stable thoracolumbar fracture: the PRESTO feasibility RCT. [place] ([place]): [employer]; 2021 Nov. [facility] Technology Assessment, No. 25.62.) Appendix 3, Oswestry Disability Index category descriptors. Available from: FindJewel[employer].cz  Minimally Clinically Important Difference (MCID) = 12.8%  COGNITION: Overall cognitive status: Within functional limits for tasks assessed     SENSATION: Reports numbness and tingling down front of right  leg   MUSCLE LENGTH: Hamstrings: stretch hamstrings  POSTURE: rounded shoulders and forward head  PALPATION: Tender mid back  LUMBAR ROM: * pain  AROM eval 11/27/23  Flexion Fingertips to mid shin * Fingertips to mid shin*  Extension 40% available; less painful 30% available  Right lateral flexion    Left lateral flexion  Right rotation    Left rotation     (Blank rows = not tested)  LOWER EXTREMITY ROM:     Active  Right eval Left eval  Hip flexion    Hip extension    Hip abduction    Hip adduction    Hip internal rotation    Hip external rotation    Knee flexion    Knee extension    Ankle dorsiflexion    Ankle plantarflexion    Ankle inversion    Ankle eversion     (Blank rows = not tested)  LOWER EXTREMITY MMT:    MMT Right eval Right 11/27/23 Left eval Left 11/27/23  Hip flexion 4- 4 4+ 4+  Hip extension 3+ 4- 4+ 4  Hip abduction      Hip adduction      Hip internal rotation      Hip external rotation      Knee flexion 4- 4 5 5   Knee extension 4- 4- 5 5  Ankle dorsiflexion 4- 4 5 5   Ankle plantarflexion      Ankle inversion      Ankle eversion       (Blank rows = not tested)  11/09/23:  SIJ check (-), LLD measurement (Lt: 39, Rt: 40.5 ASIS to medial malleoli)   FUNCTIONAL TESTS:  5 times sit to stand: 33.04 sec using hands to assist  GAIT: Distance walked: 60 ft in clinic Assistive device utilized: None Level of assistance: Modified independence Comments: antalgic gait; external rotation of right lower extremity  TREATMENT DATE:  11/27/23 Nustep seat 10 level 3 x 5' dynamic warm up Progress note 5 times sit to stand 25.04 sec Modified Oswestry 35/50; 70% (slight decline) AROM and MMT's see above   11/20/23 Nustep seat 10 level 3 x 5' dynamic warm up Heel raises on incline 2 x 10 Toe raises on decline 2 x 10 Slant board 5 x 20 Lumbar extension over the bar x 10 Body craft Leg press 7 plates 3 x 10 Hamstring curl cybex 6.5  plates 2 x 10   11/15/23 Nustep UE/LE level 4, seat 10; 6 minutes Standing:  lumbar extension on // bar 10X  Forward lunges onto 4 step no UE 2X10  Hip abduction 2X10 each LE Sit to stands 10X no UE assist  11/09/23 Nustep UE/LE level 4 seat 10 [place] 5 minutes Standing:  GTB rows 2X10  GTB shoulder extension 2X10  GTB pallof 10X each direction SIJ check (-), LLD measurement (Lt: 39, Rt: 40.5 ASIS to medial malleoli) Supine:  bridge 210  SLR 10X each   11/06/23 Supine:  decompression 2-5, 5X5 each  Decompression with theraband 1-4, 5X each direction  Bridge 10X  Logroll technique Standing:  lumbar extension 10X5   11/03/23: Review of goals Review of HEP: Standing extension at counter, 5 holds, 10x POE-->cobra/upward dog, 2' Cat/cow, 1', mild discomfort Modified Thomas stretch w/ MHP donned,  2x30 each side Decompression exercises 1-3 w/ MHP donned and wedge under LE, 10x each, 5 holds   10/31/23 physical therapy evaluation and HEP instruction  PATIENT EDUCATION:  Education details: Patient educated on exam findings, POC, scope of PT, HEP, and what to expect next visit. Person educated: Patient Education method: Explanation, Demonstration, and Handouts Education comprehension: verbalized understanding, returned demonstration, verbal cues required, and tactile cues required  HOME EXERCISE PROGRAM: Access Code: [email or web address] URL: [email or web address] Date: 10/31/2023 Prepared by: [facility]  Exercises - Supine Transversus Abdominis Bracing - Hands on Stomach  - 2 x daily - 7 x weekly - 1 sets - 10 reps - 5 sec hold - Prone on Elbows Stretch  - 2 x daily - 7 x weekly - 1 sets - 1 reps - 5 min hold - Standing Lumbar Extension  - 2 x daily - 7 x weekly - 1 sets - 10 reps - Standing Lumbar Extension with Counter  - 2 x daily  - 7 x weekly - 1 sets - 10 reps  11/06/23 decompression 1-5 5X5 each, Decompression RTB 1-4 5X each  ASSESSMENT:  CLINICAL IMPRESSION: Progress note today.  Mild improvement with strength; slight decline in Oswestry score which lines up with his subjective report of minimal change in pain symptoms.  Discharge due to progress plateau.    EVAL: Patient is a 58 y.o. male who was seen today for physical therapy evaluation and treatment for M54.50 (ICD-10-CM) - Lumbar pain M25.551,M25.552 (ICD-10-CM) - Bilateral hip pain. Patient demonstrates muscle weakness, reduced ROM, and fascial restrictions which are likely contributing to symptoms of pain and are negatively impacting patient ability to perform ADLs and functional mobility tasks. Patient will benefit from skilled physical therapy services to address these deficits to reduce pain and improve level of function with ADLs and functional mobility tasks.   OBJECTIVE IMPAIRMENTS: Abnormal gait, decreased activity tolerance, increased fascial restrictions, impaired perceived functional ability, impaired flexibility, and pain.   ACTIVITY LIMITATIONS: lifting, bending, sitting, standing, and sleeping  PARTICIPATION LIMITATIONS: meal prep, cleaning, laundry, driving, occupation, and yard work  [facility] POTENTIAL: Good  CLINICAL DECISION MAKING: Evolving/moderate complexity  EVALUATION COMPLEXITY: Moderate   GOALS: Goals reviewed with patient? Yes  SHORT TERM GOALS: Target date: 11/14/2023  patient will be independent with initial HEP  Baseline: Goal status: met  2.  Patient will report 50% improvement overall  Baseline:  Goal status: in progress  LONG TERM GOALS: Target date: 11/28/2023  Patient will be independent in self management strategies to improve quality of life and functional outcomes.   Baseline: Goal status: in progress   2.  Patient will report 70% improvement overall  Baseline:  Goal status: in progress  3.   Patient will improve Modified Oswestry score by 10 points to demonstrate improved perceived function  Baseline: 34/50; 35/50 on 11/27/23 Goal status: in progress  4.  Patient will improve 5 times sit to stand score to 15 sec or less to demonstrate improved functional mobility and increased leg strength.    Baseline: 33.04 sec at eval; 11/27/23 25.05 sec Goal status: in progress  5.   Patient will increase right leg MMT's to 4+ to 5/5 to allow navigation of steps without gait deviation or loss of balance  Baseline: see above Goal status: in progress  PLAN:  PT FREQUENCY: 2x/week  PT DURATION: 4 weeks  PLANNED INTERVENTIONS: 97164- PT Re-evaluation, 97110-Therapeutic exercises, 97530- Therapeutic activity, 97112- Neuromuscular re-education, 97535- Self Care, 02859- Manual therapy, [id number]- Gait training, [id number]- Orthotic Fit/training, [id number]- Canalith repositioning, [id number]- Aquatic Therapy, [id number]- Splinting, [id number]- Wound care (first 20 sq cm), 02401- Wound care (each additional 20 sq  cm)Patient/Family education, Balance training, Stair training, Taping, Dry Needling, Joint mobilization, Joint manipulation, Spinal manipulation, Spinal mobilization, Scar mobilization, and DME instructions. SABRA  PLAN FOR NEXT SESSION: discharge due to progress plateau 8:41 AM, 11/27/23 Lynleigh Kovack Small Korbyn Chopin MPT [facility] [place] [phone]

## 2023-12-01 ENCOUNTER — Encounter (HOSPITAL_COMMUNITY)

## 2023-12-04 ENCOUNTER — Encounter (HOSPITAL_COMMUNITY): Admitting: Physical Therapy

## 2023-12-06 ENCOUNTER — Encounter (HOSPITAL_COMMUNITY)

## 2023-12-08 ENCOUNTER — Ambulatory Visit: Admitting: Orthopedic Surgery

## 2023-12-27 DIAGNOSIS — Z419 Encounter for procedure for purposes other than remedying health state, unspecified: Secondary | ICD-10-CM | POA: Diagnosis not present

## 2024-01-26 DIAGNOSIS — Z419 Encounter for procedure for purposes other than remedying health state, unspecified: Secondary | ICD-10-CM | POA: Diagnosis not present
